# Patient Record
Sex: Female | Born: 1980 | Race: White | Hispanic: No | Marital: Single | Smoking: Former smoker
Health system: Southern US, Community
[De-identification: ages and names within clinical notes are randomized; demographics above are authoritative.]

## PROBLEM LIST (undated history)

## (undated) DIAGNOSIS — C801 Malignant (primary) neoplasm, unspecified: Secondary | ICD-10-CM

## (undated) DIAGNOSIS — R519 Headache, unspecified: Secondary | ICD-10-CM

## (undated) DIAGNOSIS — G43909 Migraine, unspecified, not intractable, without status migrainosus: Secondary | ICD-10-CM

## (undated) DIAGNOSIS — R51 Headache: Secondary | ICD-10-CM

## (undated) DIAGNOSIS — F419 Anxiety disorder, unspecified: Secondary | ICD-10-CM

## (undated) DIAGNOSIS — Z87442 Personal history of urinary calculi: Secondary | ICD-10-CM

## (undated) DIAGNOSIS — F329 Major depressive disorder, single episode, unspecified: Secondary | ICD-10-CM

## (undated) DIAGNOSIS — N289 Disorder of kidney and ureter, unspecified: Secondary | ICD-10-CM

## (undated) DIAGNOSIS — F32A Depression, unspecified: Secondary | ICD-10-CM

## (undated) HISTORY — PX: KIDNEY STONE SURGERY: SHX686

## (undated) HISTORY — PX: LITHOTRIPSY: SUR834

## (undated) HISTORY — PX: TONSILLECTOMY: SUR1361

## (undated) HISTORY — DX: Depression, unspecified: F32.A

## (undated) HISTORY — DX: Major depressive disorder, single episode, unspecified: F32.9

## (undated) HISTORY — DX: Anxiety disorder, unspecified: F41.9

---

## 2015-04-17 ENCOUNTER — Ambulatory Visit: Payer: Self-pay | Admitting: Urology

## 2015-04-17 VITALS — BP 110/70 | HR 91 | Temp 98.6°F | Wt 170.0 lb

## 2015-04-17 DIAGNOSIS — N200 Calculus of kidney: Secondary | ICD-10-CM

## 2015-04-17 DIAGNOSIS — F419 Anxiety disorder, unspecified: Secondary | ICD-10-CM

## 2015-04-17 NOTE — Progress Notes (Signed)
       Patient: Denise Patrick Female    DOB: 13-May-1980   35 y.o.   MRN: [id number] Visit Date: 04/17/2015  Today's Provider: [place]   Chief Complaint  Patient presents with  . Establish Care  . Back Pain    X 3 weeks, patient states that she would like to have her kidneys checked while she is here  . Anxiety   Subjective:    HPI Patient has a history of stones.  She has not had any imaging studies in over one year.  She has had ESWL, URS with stents and passing them spontaneously.  Stone compostion is unknown at this time.    Patient has had an episode of right flank pain recently without hematuria.    She also thinks she may be pregnant and would like a pregnancy test.      No Known Allergies Previous Medications   BUSPIRONE (BUSPAR) 15 MG TABLET    Take 15 mg by mouth daily.   FLUOXETINE (PROZAC) 20 MG CAPSULE    Take 20 mg by mouth daily.    Review of Systems  Constitutional: Negative for fever, activity change and appetite change.  HENT: Negative for sore throat and tinnitus.   Eyes: Negative for discharge and itching.  Respiratory: Negative for apnea, choking and shortness of breath.   Cardiovascular: Negative for chest pain and leg swelling.  Gastrointestinal: Positive for nausea. Negative for diarrhea and constipation.  Endocrine: Negative for cold intolerance and heat intolerance.  Genitourinary: Positive for urgency. Negative for hematuria.  Allergic/Immunologic: Negative for environmental allergies.  Neurological: Negative for dizziness and syncope.  Psychiatric/Behavioral: Positive for agitation. The patient is nervous/anxious.     Social History  Substance Use Topics  . Smoking status: Current Every Day Smoker -- 0.25 packs/day    Types: Cigarettes  . Smokeless tobacco: Never Used  . Alcohol Use: No   Objective:   BP 110/70 mmHg  Pulse 91  Temp(Src) 98.6 F (37 C)  Wt 170 lb (77.111 kg)  SpO2 98%  LMP 04/02/2015  Physical Exam    Constitutional: She is oriented to person, place, and time. She appears well-developed and well-nourished.  HENT:  Head: Normocephalic and atraumatic.  Eyes: Conjunctivae and EOM are normal. Pupils are equal, round, and reactive to light.  Cardiovascular: Normal rate, regular rhythm and normal heart sounds.   Pulmonary/Chest: Effort normal and breath sounds normal.  Abdominal: Soft. She exhibits no distension and no mass. There is tenderness. There is no rebound and no guarding.  Right cva tenderness  Musculoskeletal: Normal range of motion.  Neurological: She is alert and oriented to person, place, and time.  Skin: Skin is warm and dry.        Assessment & Plan:    History of stones  KUB  TSH, CBC, CMP, UA, HCG  Anxiety  Needs a different med for anxiety, buspar is not helping  Refer to mental health          [place]

## 2015-04-18 LAB — MICROSCOPIC EXAMINATION: Casts: NONE SEEN /LPF

## 2015-04-18 LAB — CBC WITH DIFFERENTIAL/PLATELET
Basophils Absolute: 0 x10E3/uL (ref 0.0–0.2)
Basos: 1 %
EOS (ABSOLUTE): 0.4 x10E3/uL (ref 0.0–0.4)
Eos: 5 %
Hematocrit: 40.1 % (ref 34.0–46.6)
Hemoglobin: 13.1 g/dL (ref 11.1–15.9)
Immature Grans (Abs): 0 x10E3/uL (ref 0.0–0.1)
Immature Granulocytes: 0 %
Lymphocytes Absolute: 2.5 x10E3/uL (ref 0.7–3.1)
Lymphs: 29 %
MCH: 29.9 pg (ref 26.6–33.0)
MCHC: 32.7 g/dL (ref 31.5–35.7)
MCV: 92 fL (ref 79–97)
Monocytes Absolute: 0.6 x10E3/uL (ref 0.1–0.9)
Monocytes: 7 %
Neutrophils Absolute: 5 x10E3/uL (ref 1.4–7.0)
Neutrophils: 58 %
Platelets: 369 x10E3/uL (ref 150–379)
RBC: 4.38 x10E6/uL (ref 3.77–5.28)
RDW: 13.9 % (ref 12.3–15.4)
WBC: 8.5 x10E3/uL (ref 3.4–10.8)

## 2015-04-18 LAB — TSH: TSH: 0.988 u[IU]/mL (ref 0.450–4.500)

## 2015-04-18 LAB — URINALYSIS, COMPLETE
Bilirubin, UA: NEGATIVE
GLUCOSE, UA: NEGATIVE
Ketones, UA: NEGATIVE
Leukocytes, UA: NEGATIVE
Nitrite, UA: NEGATIVE
PROTEIN UA: NEGATIVE
Specific Gravity, UA: 1.013 (ref 1.005–1.030)
Urobilinogen, Ur: 0.2 mg/dL (ref 0.2–1.0)
pH, UA: 6 (ref 5.0–7.5)

## 2015-04-18 LAB — HCG, SERUM, QUALITATIVE: hCG,Beta Subunit,Qual,Serum: NEGATIVE m[IU]/mL

## 2015-05-01 ENCOUNTER — Ambulatory Visit: Payer: Self-pay | Admitting: Family Medicine

## 2015-05-01 VITALS — BP 117/59 | HR 70 | Resp 16 | Ht 66.0 in | Wt 170.0 lb

## 2015-05-01 DIAGNOSIS — H539 Unspecified visual disturbance: Secondary | ICD-10-CM | POA: Insufficient documentation

## 2015-05-01 DIAGNOSIS — K0889 Other specified disorders of teeth and supporting structures: Secondary | ICD-10-CM

## 2015-05-01 DIAGNOSIS — M549 Dorsalgia, unspecified: Secondary | ICD-10-CM | POA: Insufficient documentation

## 2015-05-01 MED ORDER — MELOXICAM 15 MG PO TABS: 15.0000 mg | ORAL_TABLET | Freq: Every day | ORAL | Status: DC

## 2015-05-01 NOTE — Progress Notes (Signed)
       Patient: Denise Patrick Female    DOB: 02/22/1980   35 y.o.   MRN: [id number] Visit Date: 05/01/2015  Today's Provider: [place]   Chief Complaint  Patient presents with  . Follow-up    review bloodwork, smoking cessation progress   Subjective:    HPI 20 you female here for recheck . Needs her eyes checked.   Can not see far away. Also needs referral to the dental clinic for tooth pulled.  Has severe back pain. Was on Mobic. Did help. Has not has in 2 months.  Was in prison at the time.      No Known Allergies Previous Medications   BUSPIRONE (BUSPAR) 15 MG TABLET    Take 15 mg by mouth daily.   FLUOXETINE (PROZAC) 20 MG CAPSULE    Take 20 mg by mouth daily.    Review of Systems  Musculoskeletal: Positive for back pain. Neck pain: Upper back, worse with stress.     Social History  Substance Use Topics  . Smoking status: Current Every Day Smoker -- 0.25 packs/day    Types: Cigarettes  . Smokeless tobacco: Never Used  . Alcohol Use: No   Objective:   BP 117/59 mmHg  Pulse 70  Resp 16  Ht 5\' 6"  (1.676 m)  Wt 170 lb (77.111 kg)  BMI 27.45 kg/m2  LMP 04/02/2015  Physical Exam  Constitutional: She is oriented to person, place, and time. She appears well-developed and well-nourished.  Neurological: She is alert and oriented to person, place, and time.  Psychiatric: She has a normal mood and affect. Her behavior is normal. Judgment and thought content normal.  Patient irritated that her referral where not done.        Assessment & Plan:     1. Upper back pain Will refill meloxicam. Did help her previously.  - meloxicam (MOBIC) 15 MG tablet; Take 1 tablet (15 mg total) by mouth daily.  Dispense: 90 tablet; Refill: 3  2. Vision changes Will refer.    3. Tooth pain Will refer to dental clinic.    Margarita Rana, MD       [facility]

## 2015-05-09 ENCOUNTER — Ambulatory Visit: Payer: Self-pay | Admitting: Ophthalmology

## 2015-05-16 ENCOUNTER — Ambulatory Visit: Payer: Self-pay | Admitting: Ophthalmology

## 2015-05-21 ENCOUNTER — Ambulatory Visit: Payer: Self-pay

## 2015-06-06 ENCOUNTER — Encounter (HOSPITAL_COMMUNITY): Payer: Self-pay | Admitting: *Deleted

## 2015-06-27 ENCOUNTER — Ambulatory Visit: Payer: Self-pay | Attending: Oncology | Admitting: *Deleted

## 2015-06-27 ENCOUNTER — Encounter: Payer: Self-pay | Admitting: *Deleted

## 2015-06-27 DIAGNOSIS — R87610 Atypical squamous cells of undetermined significance on cytologic smear of cervix (ASC-US): Secondary | ICD-10-CM

## 2015-06-27 NOTE — Progress Notes (Signed)
35 year old White female referred to [facility] by Etheleen Sia, RN [facility] nurse at [facility] for further evaluation of an abnormal pap of HPV positive ASCUS that was completed through one of [facility] free screening programs.  Patient has been screened for eligibility.  She does not have any insurance, Medicare or Medicaid.  She also meets financial eligibility.  Hand-out given on the Affordable Care Act.  Patient will be scheduled to see one of the GYN physicians at [facility] by Jeanella Anton for further evaluation.  Will follow-up per [facility] protocol.

## 2015-08-01 ENCOUNTER — Encounter: Payer: Self-pay | Admitting: Obstetrics and Gynecology

## 2015-09-06 ENCOUNTER — Ambulatory Visit (INDEPENDENT_AMBULATORY_CARE_PROVIDER_SITE_OTHER): Payer: Medicaid Other | Admitting: Obstetrics and Gynecology

## 2015-09-06 ENCOUNTER — Encounter: Payer: Self-pay | Admitting: Obstetrics and Gynecology

## 2015-09-06 ENCOUNTER — Other Ambulatory Visit: Payer: Self-pay | Admitting: Obstetrics and Gynecology

## 2015-09-06 VITALS — BP 111/68 | HR 91 | Ht 66.0 in | Wt 190.6 lb

## 2015-09-06 DIAGNOSIS — R896 Abnormal cytological findings in specimens from other organs, systems and tissues: Secondary | ICD-10-CM

## 2015-09-06 DIAGNOSIS — Z720 Tobacco use: Secondary | ICD-10-CM | POA: Insufficient documentation

## 2015-09-06 DIAGNOSIS — IMO0002 Reserved for concepts with insufficient information to code with codable children: Secondary | ICD-10-CM | POA: Insufficient documentation

## 2015-09-06 DIAGNOSIS — N946 Dysmenorrhea, unspecified: Secondary | ICD-10-CM | POA: Diagnosis not present

## 2015-09-06 DIAGNOSIS — N809 Endometriosis, unspecified: Secondary | ICD-10-CM | POA: Insufficient documentation

## 2015-09-06 MED ORDER — NORGESTIMATE-ETH ESTRADIOL 0.25-35 MG-MCG PO TABS: 1.0000 | ORAL_TABLET | Freq: Every day | ORAL | 11 refills | Status: DC

## 2015-09-06 NOTE — Patient Instructions (Signed)
Colposcopy Care After Colposcopy is a procedure in which a special tool is used to magnify the surface of the cervix. A tissue sample (biopsy) may also be taken. This sample will be looked at for cervical cancer or other problems. After the test:  You may have some cramping.  Lie down for a few minutes if you feel lightheaded.   You may have some bleeding which should stop in a few days. HOME CARE  Do not have sex or use tampons for 2 to 3 days or as told.  Only take medicine as told by your doctor.  Continue to take your birth control pills as usual. Finding out the results of your test Ask when your test results will be ready. Make sure you get your test results. GET HELP RIGHT AWAY IF:  You are bleeding a lot or are passing blood clots.  You develop a fever of 102 F (38.9 C) or higher.  You have abnormal vaginal discharge.  You have cramps that do not go away with medicine.  You feel lightheaded, dizzy, or pass out (faint). MAKE SURE YOU:   Understand these instructions.  Will watch your condition.  Will get help right away if you are not doing well or get worse.   This information is not intended to replace advice given to you by your health care provider. Make sure you discuss any questions you have with your health care provider.   Document Released: 06/25/2007 Document Revised: 03/31/2011 Document Reviewed: 08/05/2012 Elsevier Interactive Patient Education [employer].    Endometriosis Endometriosis is a condition in which the tissue that lines the uterus (endometrium) grows outside of its normal location. The tissue may grow in many locations close to the uterus, but it commonly grows on the ovaries, fallopian tubes, vagina, or bowel. Because the uterus expels, or sheds, its lining every menstrual cycle, there is bleeding wherever the endometrial tissue is located. This can cause pain because blood is irritating to tissues not normally exposed to it.    CAUSES  The cause of endometriosis is not known.  SIGNS AND SYMPTOMS  Often, there are no symptoms. When symptoms are present, they can vary with the location of the displaced tissue. Various symptoms can occur at different times. Although symptoms occur mainly during a woman's menstrual period, they can also occur midcycle and usually stop with menopause. Some people may go months with no symptoms at all. Symptoms may include:   Back or abdominal pain.   Heavier bleeding during periods.   Pain during intercourse.   Painful bowel movements.   Infertility. DIAGNOSIS  Your health care provider will do a physical exam and ask about your symptoms. Various tests may be done, such as:   Blood tests and urine tests. These are done to help rule out other problems.   Ultrasound. This test is done to look for abnormal tissue.   An X-ray of the lower bowel (barium enema).  Laparoscopy. In this procedure, a thin, lighted tube with a tiny camera on the end (laparoscope) is inserted into your abdomen. This helps your health care provider look for abnormal tissue to confirm the diagnosis. The health care provider may also remove a small piece of tissue (biopsy) from any abnormal tissue found. This tissue sample can then be sent to a lab so it can be looked at under a microscope. TREATMENT  Treatment will vary and may include:   Medicines to relieve pain. Nonsteroidal anti-inflammatory drugs (NSAIDs) are a  type of pain medicine that can help to relieve the pain caused by endometriosis.  Hormonal therapy. When using hormonal therapy, periods are eliminated. This eliminates the monthly exposure to blood by the displaced endometrial tissue.   Surgery. Surgery may sometimes be done to remove the abnormal endometrial tissue. In severe cases, surgery may be done to remove the fallopian tubes, uterus, and ovaries (hysterectomy). HOME CARE INSTRUCTIONS   Take all medicines as directed by your  health care provider. Do not take aspirin because it may increase bleeding when you are not on hormonal therapy.   Avoid activities that produce pain, including sexual activity. SEEK MEDICAL CARE IF:  You have pelvic pain before, after, or during your periods.  You have pelvic pain between periods that gets worse during your period.  You have pelvic pain during or after sex.  You have pelvic pain with bowel movements or urination, especially during your period.  You have problems getting pregnant.  You have a fever. SEEK IMMEDIATE MEDICAL CARE IF:   Your pain is severe and is not responding to pain medicine.   You have severe nausea and vomiting, or you cannot keep foods down.   You have pain that is limited to the right lower part of your abdomen.   You have swelling or increasing pain in your abdomen.   You see blood in your stool.  MAKE SURE YOU:   Understand these instructions.  Will watch your condition.  Will get help right away if you are not doing well or get worse.   This information is not intended to replace advice given to you by your health care provider. Make sure you discuss any questions you have with your health care provider.   Document Released: 01/04/2000 Document Revised: 01/27/2014 Document Reviewed: 09/03/2012 Elsevier Interactive Patient Education [employer].

## 2015-09-06 NOTE — Progress Notes (Signed)
GYN ENCOUNTER NOTE  Subjective:       Denise Patrick is a 35 y.o. G93P2002 female is here for gynecologic evaluation of the following issues:  1. ASCUS/positive Pap smear  Patient was evaluated at the [facility] program and had ASCUS/positive Pap smear. She has long history of endometriosis; severe dysmenorrhea and pelvic pain currently is not treated. Patient is a 2 pack a day smoker. Patient has not had any new partners recently. She has a history of Pap smear abnormalities in 2010 ; did not receive any treatment   Gynecologic History Patient's last menstrual period was 08/25/2015. Contraception: condoms Last Pap: ASCUS/positive  Obstetric History OB History  Gravida Para Term Preterm AB Living  2 2 2     2   SAB TAB Ectopic Multiple Live Births          2    # Outcome Date GA Lbr Len/2nd Weight Sex Delivery Anes PTL Lv  2 Term 2006   7 lb 1.8 oz (3.225 kg) F Vag-Spont   LIV  1 Term 2002   6 lb 14.4 oz (3.13 kg) F Vag-Spont   LIV      Past Medical History:  Diagnosis Date  . Anxiety   . Depression     Past Surgical History:  Procedure Laterality Date  . KIDNEY STONE SURGERY    . TONSILLECTOMY      No current outpatient prescriptions on file prior to visit.   No current facility-administered medications on file prior to visit.     No Known Allergies  Social History   Social History  . Marital status: Unknown    Spouse name: N/A  . Number of children: N/A  . Years of education: N/A   Occupational History  . Not on file.   Social History Main Topics  . Smoking status: Current Every Day Smoker    Packs/day: 0.25    Types: Cigarettes  . Smokeless tobacco: Never Used  . Alcohol use No  . Drug use: No  . Sexual activity: Not Currently    Birth control/ protection: None   Other Topics Concern  . Not on file   Social History Narrative  . No narrative on file    Family History  Problem Relation Age of Onset  . Hypertension Father   . Cancer Father   .  Cancer Maternal Grandmother   . COPD Maternal Grandmother     The following portions of the patient's history were reviewed and updated as appropriate: allergies, current medications, past family history, past medical history, past social history, past surgical history and problem list.  Review of Systems Review of Systems - Per history of present illness Objective:   BP 111/68   Pulse 91   Ht 5\' 6"  (1.676 m)   Wt 190 lb 9.6 oz (86.5 kg)   LMP 08/25/2015   BMI 30.76 kg/m  CONSTITUTIONAL: Well-developed, well-nourished female in no acute distress.  HENT:  Normocephalic, atraumatic.  NECK: Not examined SKIN: Skin is warm and dry. No rash noted. Not diaphoretic. No erythema. No pallor. [place]: Alert and oriented to person, place, and time. PSYCHIATRIC: Normal mood and affect. Normal behavior. Normal judgment and thought content. CARDIOVASCULAR:Not Examined RESPIRATORY: Not Examined BREASTS: Not Examined ABDOMEN: Soft, non distended; Non tender.  No Organomegaly. PELVIC:  External Genitalia: Normal  BUS: Normal  Vagina: Normal  Cervix: Normal; no gross lesions; parous  Uterus: Not examined  Adnexa: Normal  RV: Normal external exam  Bladder: Nontender MUSCULOSKELETAL: Normal  range of motion. No tenderness.  No cyanosis, clubbing, or edema.  PROCEDURE: Colposcopy of upper adjacent vagina with cervical biopsy and ECC Indications: ASCUS/positive Pap smear Findings: Inadequate colposcopy; acetowhite epithelium is located at the 5 through 6 through 7:00 cervix with full extent of the lesion cannot be seen; mosaicism at 6:00; mosaicism at 7:00; vagina normal Biopsies:  ECC  Cervix biopsy 6:00  Cervix biopsy 7:00 Description: Patient was placed in the dorsal lithotomy position. A Graves' speculum was placed in the vagina. Acetic acid was used to paint the cervix and vagina to assess for abnormalities. The colposcope was used to identify the noted abnormal lesions. These lesions  were biopsied with a Tischler biopsy forceps. ECC was performed with a serrated curette. Monsel solution was applied for hemostasis. Blood loss-15 cc. Patient had fair tolerance of the procedure; was given Advil following procedure for cramps      Assessment:   1. ASCUS with positive high risk HPV  2. Endometriosis  3. Tobacco user     Plan:   1. Colposcopy with biopsies as noted (ECC, cervix 6:00, cervix 7:00) 2. Smoking cessation encouraged 3. Sprintec OCPs are given for endometriosis management 4. Return in 2 weeks for follow-up  A total of 30 minutes were spent face-to-face with the patient during the encounter with greater than 50% dealing with counseling and coordination of care.  Brayton Mars, MD  Note: This dictation was prepared with Dragon dictation along with smaller phrase technology. Any transcriptional errors that result from this process are unintentional.

## 2015-09-10 ENCOUNTER — Encounter: Payer: Self-pay | Admitting: Obstetrics and Gynecology

## 2015-09-10 LAB — PATHOLOGY

## 2015-09-12 ENCOUNTER — Encounter: Payer: Self-pay | Admitting: Internal Medicine

## 2015-09-12 ENCOUNTER — Other Ambulatory Visit: Payer: Self-pay

## 2015-09-12 ENCOUNTER — Ambulatory Visit: Payer: Self-pay | Admitting: Internal Medicine

## 2015-09-12 VITALS — BP 113/64 | HR 85 | Temp 98.7°F | Wt 191.0 lb

## 2015-09-12 DIAGNOSIS — G43909 Migraine, unspecified, not intractable, without status migrainosus: Secondary | ICD-10-CM | POA: Insufficient documentation

## 2015-09-12 DIAGNOSIS — L709 Acne, unspecified: Secondary | ICD-10-CM

## 2015-09-12 MED ORDER — TOPIRAMATE 25 MG PO TABS: 25.0000 mg | ORAL_TABLET | Freq: Two times a day (BID) | ORAL | 3 refills | Status: DC

## 2015-09-12 MED ORDER — NORGESTIMATE-ETH ESTRADIOL 0.25-35 MG-MCG PO TABS: 1.0000 | ORAL_TABLET | Freq: Every day | ORAL | 11 refills | Status: DC

## 2015-09-12 MED ORDER — TRIAMCINOLONE ACETONIDE 0.1 % EX CREA: 1.0000 "application " | TOPICAL_CREAM | Freq: Two times a day (BID) | CUTANEOUS | 0 refills | Status: DC

## 2015-09-12 NOTE — Progress Notes (Signed)
   Subjective:    Patient ID: Denise Patrick, female    DOB: 1980/11/23, 35 y.o.   MRN: [id number]  HPI   Pt. Presents w/ migraines. Reports having migraines for 2 mnths w/ trouble remaining asleep. Reports severe pain w/ no relief from Alleve. Migraines last for 1-2 days w/ nausea and light irritation. Reports taking a 80 count bottle or more of Excedrin a week.  Pt. Presents w/ cysts on face that are painful. Reports that they remain on her face for a long period. Reports that hasn't had acne for awhile until now.  Pt. Reports abnormal sleep patterns w/ trouble staying asleep. She reports she has recently started a new job.    Patient Active Problem List   Diagnosis Date Noted  . Migraine 09/12/2015  . Endometriosis 09/06/2015  . Dysmenorrhea 09/06/2015  . ASCUS with positive high risk HPV 09/06/2015  . Tobacco user 09/06/2015  . Upper back pain 05/01/2015  . Vision changes 05/01/2015  . Tooth pain 05/01/2015     Medication List       Accurate as of 09/12/15 10:18 AM. Always use your most recent med list.          norgestimate-ethinyl estradiol 0.25-35 MG-MCG tablet Commonly known as:  ORTHO-CYCLEN,SPRINTEC,PREVIFEM Take 1 tablet by mouth daily.   TRINTELLIX 10 MG Tabs Generic drug:  vortioxetine HBr Take by mouth.        Review of Systems Cysts on face     Objective:   Physical Exam  Constitutional: She is oriented to person, place, and time.  Cardiovascular: Normal rate, regular rhythm and normal heart sounds.   Pulmonary/Chest: Effort normal and breath sounds normal.  Neurological: She is alert and oriented to person, place, and time.    BP 113/64   Pulse 85   Temp 98.7 F (37.1 C)   Wt 191 lb (86.6 kg)   LMP 08/25/2015   BMI 30.83 kg/m        Assessment & Plan:  Referral to dermatologist for acne. Labs today: Met C, CBC, UA, Lipid, Sed Rat  Pt. Advised on sleep techniques.

## 2015-09-12 NOTE — Patient Instructions (Signed)
Referral to dermatologist for acne Labs today: Met C, CBC, UA, Lipid, Sed Rat

## 2015-09-13 LAB — LIPID PANEL
CHOLESTEROL TOTAL: 159 mg/dL (ref 100–199)
Chol/HDL Ratio: 3.3 ratio units (ref 0.0–4.4)
HDL: 48 mg/dL (ref >39)
LDL Calculated: 86 mg/dL (ref 0–99)
TRIGLYCERIDES: 126 mg/dL (ref 0–149)
VLDL CHOLESTEROL CAL: 25 mg/dL (ref 5–40)

## 2015-09-13 LAB — COMPREHENSIVE METABOLIC PANEL
ALT: 13 IU/L (ref 0–32)
AST: 17 IU/L (ref 0–40)
Albumin/Globulin Ratio: 1.7 (ref 1.2–2.2)
Albumin: 4.3 g/dL (ref 3.5–5.5)
Alkaline Phosphatase: 59 IU/L (ref 39–117)
BILIRUBIN TOTAL: 0.3 mg/dL (ref 0.0–1.2)
BUN/Creatinine Ratio: 21 (ref 9–23)
BUN: 18 mg/dL (ref 6–20)
CHLORIDE: 106 mmol/L (ref 96–106)
CO2: 19 mmol/L (ref 18–29)
Calcium: 8.9 mg/dL (ref 8.7–10.2)
Creatinine, Ser: 0.86 mg/dL (ref 0.57–1.00)
GFR calc Af Amer: 103 mL/min/{1.73_m2} (ref >59)
GFR calc non Af Amer: 89 mL/min/{1.73_m2} (ref >59)
GLUCOSE: 95 mg/dL (ref 65–99)
Globulin, Total: 2.5 g/dL (ref 1.5–4.5)
POTASSIUM: 4.5 mmol/L (ref 3.5–5.2)
Sodium: 140 mmol/L (ref 134–144)
Total Protein: 6.8 g/dL (ref 6.0–8.5)

## 2015-09-13 LAB — CBC
HEMATOCRIT: 41 % (ref 34.0–46.6)
HEMOGLOBIN: 13.5 g/dL (ref 11.1–15.9)
MCH: 30.3 pg (ref 26.6–33.0)
MCHC: 32.9 g/dL (ref 31.5–35.7)
MCV: 92 fL (ref 79–97)
Platelets: 393 10*3/uL — ABNORMAL HIGH (ref 150–379)
RBC: 4.45 x10E6/uL (ref 3.77–5.28)
RDW: 14.1 % (ref 12.3–15.4)
WBC: 7.4 10*3/uL (ref 3.4–10.8)

## 2015-09-13 LAB — URINALYSIS
BILIRUBIN UA: NEGATIVE
Glucose, UA: NEGATIVE
Ketones, UA: NEGATIVE
Leukocytes, UA: NEGATIVE
NITRITE UA: NEGATIVE
PH UA: 6 (ref 5.0–7.5)
PROTEIN UA: NEGATIVE
SPEC GRAV UA: 1.019 (ref 1.005–1.030)
UUROB: 0.2 mg/dL (ref 0.2–1.0)

## 2015-09-13 LAB — SEDIMENTATION RATE: Sed Rate: 6 mm/hr (ref 0–32)

## 2015-09-18 ENCOUNTER — Telehealth: Payer: Self-pay | Admitting: *Deleted

## 2015-09-18 NOTE — Telephone Encounter (Signed)
Left patient a message to return my call.  Patient has CINII-III.  I would like for her to complete [facility] Medicaid paperwork.

## 2015-09-27 ENCOUNTER — Ambulatory Visit (INDEPENDENT_AMBULATORY_CARE_PROVIDER_SITE_OTHER): Payer: Medicaid Other | Admitting: Obstetrics and Gynecology

## 2015-09-27 ENCOUNTER — Encounter: Payer: Self-pay | Admitting: *Deleted

## 2015-09-27 DIAGNOSIS — D069 Carcinoma in situ of cervix, unspecified: Secondary | ICD-10-CM | POA: Diagnosis not present

## 2015-09-27 NOTE — Progress Notes (Signed)
Patient came by the office today after her visit with Dr. Enzo Bi to complete her [facility] Medicaid application.  Application completed.  No questions at this time.  She is to be scheduled for a LEEP.  Faxed application to DSS.

## 2015-09-27 NOTE — Patient Instructions (Signed)
1. LEEP cone biopsy is scheduled for management of endocervical dysplasia 2. Return week before surgery for preoperative appointment  Loop Electrosurgical Excision Procedure Loop electrosurgical excision procedure (LEEP) is the removal of a portion of the lower part of the uterus (cervix). The procedure is done when there are significantly abnormal cervical cell changes. Abnormal cell changes of the cervix can lead to cancer if left in place and untreated.  The LEEP procedure itself typically only takes a few minutes. Often, it may be done in your caregiver's office. The procedure is considered safe for those who wish to get pregnant or are trying to get pregnant. Only under rare circumstances should this procedure be done if you are pregnant. LET YOUR CAREGIVER KNOW ABOUT:  Whether you are pregnant or late for your last menstrual period.  Allergies to foods or medicines.  All the medicines you are taking includingherbs, eyedrops, and over-the-counter medicines, and creams.  Use of steroids (by mouth or creams).  Previous problems with anesthetics or numbing medicine.  Previous gynecological surgery.  History of blood clots or bleeding problems.  Any recent or current vaginal infections (herpes, sexually transmitted infections).  Other health problems. RISKS AND COMPLICATIONS  Bleeding.  Infection.  Injury to the vagina, bladder, or rectum.  Very rare obstruction of the cervical opening that causes problems during menstruation (cervical stenosis). BEFORE THE PROCEDURE  Do not take aspirin or blood thinners (anticoagulants) for 1 week before the procedure, or as told by your caregiver.  Eat a light meal before the procedure.  Ask your caregiver about changing or stopping your regular medicines.  You may be given a pain reliever 1 or 2 hours before the procedure. PROCEDURE   A tool (speculum) is placed in the vagina. This allows your caregiver to see the cervix.  An  iodine stain is applied to the cervix to find the area of abnormal cells to be removed.  Medicine is injected to numb the cervix (local anesthetic).   Electricity is passed through a thin wire loop which is then used to remove (cauterize) a small segment of the affected cervix.  Light electrocautery is used to seal any small blood vessels and prevent bleeding.  A paste may be applied to the cauterized area of the cervix to help prevent bleeding.  The tissue sample is sent to the lab. It is examined under the microscope. AFTER THE PROCEDURE  Have someone drive you home.  You may have slight to moderate cramping.  You may notice a black vaginal discharge from the paste used on the cervix to prevent bleeding. This is normal.  Watch for excessive bleeding. This requires immediate medical care.  Ask when your test results will be ready. Make sure you get your test results.   This information is not intended to replace advice given to you by your health care provider. Make sure you discuss any questions you have with your health care provider.   Document Released: 03/29/2002 Document Revised: 03/31/2011 Document Reviewed: 06/18/2010 Elsevier Interactive Patient Education [employer].

## 2015-09-27 NOTE — Progress Notes (Signed)
Chief complaint: 1. Abnormal Pap smear 2. Follow-up on colposcopic directed biopsies  Recent colposcopy was performed for ASCUS/positive high risk HPV Pap smear. Colposcopy demonstrated acetowhite epithelium and mosaicism between the 5:00, 6:00, and 7:00 region. The SCJ was not visualized.  Pathology: Diagnosis:  Part A: CERVIX, 6:00, BIOPSY:  NEGATIVE FOR DYSPLASIA AND MALIGNANCY. BENIGN CERVICAL TISSUE.  Part B: CERVIX, 7:00, BIOPSY:  KOILOCYTOSIS SUGGESTIVE OF HUMAN PAPILLOMAVIRUS (HPV) EFFECT.  Part C: ENDOCERVIX, CURETTINGS:  STRIPS OF SQUAMOUS EPITHELIUM WITH HIGH GRADE SQUAMOUS  INTRAEPITHELIAL LESION (CIN 2-3). MINUTE FRAGMENTS OF BENIGN  ENDOCERVICAL GLANDS, MUCUS, AND BLOOD.  SBI/09/10/2015   OBJECTIVE: BP 109/64   Pulse 75   Ht 5\' 6"  (1.676 m)   Wt 192 lb 3.2 oz (87.2 kg)   LMP 09/27/2015 (Exact Date)   BMI 31.02 kg/m  Physical exam-deferred  ASSESSMENT: 1. ASCUS/positive high risk HPV Pap smear 2. Inadequate colposcopy 3. 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 LEEP cone biopsy scheduled 2. Return for preoperative appointment prior surgery 3. Quit smoking 4. Condom use encouraged  A total of 15 minutes were spent face-to-face with the patient during this encounter and over half of that time dealt with counseling and coordination of care.  Brayton Mars, MD  Note: This dictation was prepared with Dragon dictation along with smaller phrase technology. Any transcriptional errors that result from this process are unintentional.

## 2015-11-01 ENCOUNTER — Ambulatory Visit (INDEPENDENT_AMBULATORY_CARE_PROVIDER_SITE_OTHER): Payer: Medicaid Other | Admitting: Obstetrics and Gynecology

## 2015-11-01 ENCOUNTER — Encounter
Admission: RE | Admit: 2015-11-01 | Discharge: 2015-11-01 | Disposition: A | Discharge status: Home / Self Care | Payer: Medicaid Other | Source: Ambulatory Visit | Attending: Obstetrics and Gynecology | Admitting: Obstetrics and Gynecology

## 2015-11-01 VITALS — BP 101/66 | HR 72 | Ht 66.0 in | Wt 193.9 lb

## 2015-11-01 DIAGNOSIS — Z01812 Encounter for preprocedural laboratory examination: Secondary | ICD-10-CM | POA: Insufficient documentation

## 2015-11-01 DIAGNOSIS — Z01818 Encounter for other preprocedural examination: Secondary | ICD-10-CM

## 2015-11-01 DIAGNOSIS — D069 Carcinoma in situ of cervix, unspecified: Secondary | ICD-10-CM

## 2015-11-01 HISTORY — DX: Personal history of urinary calculi: Z87.442

## 2015-11-01 HISTORY — DX: Headache: R51

## 2015-11-01 HISTORY — DX: Headache, unspecified: R51.9

## 2015-11-01 LAB — CBC WITH DIFFERENTIAL/PLATELET
Basophils Absolute: 0.1 10*3/uL (ref 0–0.1)
Basophils Relative: 1 %
EOS ABS: 0.3 10*3/uL (ref 0–0.7)
Eosinophils Relative: 3 %
HEMATOCRIT: 39.8 % (ref 35.0–47.0)
HEMOGLOBIN: 13.8 g/dL (ref 12.0–16.0)
LYMPHS ABS: 1.9 10*3/uL (ref 1.0–3.6)
Lymphocytes Relative: 19 %
MCH: 31 pg (ref 26.0–34.0)
MCHC: 34.8 g/dL (ref 32.0–36.0)
MCV: 89 fL (ref 80.0–100.0)
MONOS PCT: 8 %
Monocytes Absolute: 0.8 10*3/uL (ref 0.2–0.9)
NEUTROS PCT: 69 %
Neutro Abs: 7 10*3/uL — ABNORMAL HIGH (ref 1.4–6.5)
Platelets: 317 10*3/uL (ref 150–440)
RBC: 4.47 MIL/uL (ref 3.80–5.20)
RDW: 13.3 % (ref 11.5–14.5)
WBC: 10 10*3/uL (ref 3.6–11.0)

## 2015-11-01 LAB — RAPID HIV SCREEN (HIV 1/2 AB+AG)
HIV 1/2 Antibodies: NONREACTIVE
HIV-1 P24 ANTIGEN - HIV24: NONREACTIVE

## 2015-11-01 MED ORDER — NORETHIN-ETH ESTRAD-FE BIPHAS 1 MG-10 MCG / 10 MCG PO TABS: 1.0000 | ORAL_TABLET | Freq: Every day | ORAL | 11 refills | Status: DC

## 2015-11-01 NOTE — Patient Instructions (Signed)
  Your procedure is scheduled [id number] Oct. 16 , 2017. Report to Same Day Surgery. To find out your arrival time please call [phone] between 1PM - 3PM on Friday Oct. 13, 2017, .  Remember: Instructions that are not followed completely may result in serious medical risk, up to and including death, or upon the discretion of your surgeon and anesthesiologist your surgery may need to be rescheduled.    _x___ 1. Do not eat food or drink liquids after midnight. No gum chewing or hard candies.     ____ 2. No Alcohol for 24 hours before or after surgery.   ____ 3. Bring all medications with you on the day of surgery if instructed.    __x__ 4. Notify your doctor if there is any change in your medical condition     (cold, fever, infections).    __x___ 5. No smoking 24 hours prior to surgery.     Do not wear jewelry, make-up, hairpins, clips or nail polish.  Do not wear lotions, powders, or perfumes.   Do not shave 48 hours prior to surgery. Men may shave face and neck.  Do not bring valuables to the hospital.    [facility] is not responsible for any belongings or valuables.               Contacts, dentures or bridgework may not be worn into surgery.  Leave your suitcase in the car. After surgery it may be brought to your room.  For patients admitted to the hospital, discharge time is determined by your treatment team.   Patients discharged the day of surgery will not be allowed to drive home.    Please read over the following fact sheets that you were given:   [facility] Preparing for Surgery  ____ Take these medicines the morning of surgery with A SIP OF WATER: NONE     ____ Fleet Enema (as directed)   ____ Use CHG Soap as directed on instruction sheet  ____ Use inhalers on the day of surgery and bring to hospital day of surgery  ____ Stop metformin 2 days prior to surgery    ____ Take 1/2 of usual insulin dose the night before surgery and none on the morning of surgery.    ____ Stop Coumadin/Plavix/aspirin on does not apply.  _x___ Stop Anti-inflammatories such as Advil, Aleve, Ibuprofen, Motrin, Naproxen, Naprosyn, Goodies powders or aspirin products. OK to take Tylenol.   ____ Stop supplements until after surgery.    ____ Bring C-Pap to the hospital.

## 2015-11-01 NOTE — Progress Notes (Signed)
Subjective:  PREOPERATIVE HISTORY AND PHYSICAL     Patient is a 35 y.o. G2P2064female scheduled forLEEP cone biopsy of the cervix for positive ECC of high-grade dysplasia on 11/05/2015. Colposcopy was inadequate; ECC was positive for high-grade dysplasia   Menstrual History: OB History    Gravida Para Term Preterm AB Living   2 2 2     2    SAB TAB Ectopic Multiple Live Births           2      Menarche age:NA Patient's last menstrual period was 10/25/2015.    Past Medical History:  Diagnosis Date  . Anxiety   . Depression     Past Surgical History:  Procedure Laterality Date  . KIDNEY STONE SURGERY    . TONSILLECTOMY      OB History  Gravida Para Term Preterm AB Living  2 2 2     2   SAB TAB Ectopic Multiple Live Births          2    # Outcome Date GA Lbr Len/2nd Weight Sex Delivery Anes PTL Lv  2 Term 2006   7 lb 1.8 oz (3.225 kg) F Vag-Spont   LIV  1 Term 2002   6 lb 14.4 oz (3.13 kg) F Vag-Spont   LIV      Social History   Social History  . Marital status: Single    Spouse name: N/A  . Number of children: N/A  . Years of education: N/A   Social History Main Topics  . Smoking status: Current Every Day Smoker    Packs/day: 0.50    Years: 15.00    Types: Cigarettes  . Smokeless tobacco: Current User  . Alcohol use No  . Drug use: No  . Sexual activity: Not Currently    Birth control/ protection: None   Other Topics Concern  . Not on file   Social History Narrative  . No narrative on file    Family History  Problem Relation Age of Onset  . Hypertension Father   . Cancer Father   . Cancer Maternal Grandmother   . COPD Maternal Grandmother      (Not in a hospital admission)  No Known Allergies  Review of Systems Constitutional: No recent fever/chills/sweats Respiratory: No recent cough/bronchitis Cardiovascular: No chest pain Gastrointestinal: No recent nausea/vomiting/diarrhea Genitourinary: No UTI symptoms Hematologic/lymphatic:No  history of coagulopathy or recent blood thinner use    Objective:    BP 101/66   Pulse 72   Ht 5\' 6"  (1.676 m)   Wt 193 lb 14.4 oz (88 kg)   LMP 10/25/2015   BMI 31.30 kg/m   General:   Normal  Skin:   normal  HEENT:  Normal  Neck:  Supple without Adenopathy or Thyromegaly  Lungs:   Heart:              Breasts:   Abdomen:  Pelvis:  M/S   Extremeties:  Neuro:    clear to auscultation bilaterally   Normal without murmur   Not Examined   soft, non-tender; bowel sounds normal; no masses,  no organomegaly   Exam deferred to OR  No CVAT  Warm/Dry   Normal          Assessment:    Positive ECC; high-grade endocervical dysplasia   Plan:  LEEP cone biopsy of cervix  Preop counseling: Patient is to undergo LEEP cone biopsy of cervix for high-grade endocervical dysplasia. She is understanding of the planned procedure  and is aware of and is accepting of all surgical risks which include but are not limited to bleeding, infection, pelvic organ injury with need for repair, blood clot disorders, anesthesia risks, and death. All questions are answered. Informed consent is given. Patient is ready and willing to proceed with surgery as scheduled.  Brayton Mars, MD  Note: This dictation was prepared with Dragon dictation along with smaller phrase technology. Any transcriptional errors that result from this process are unintentional.

## 2015-11-01 NOTE — H&P (Signed)
Subjective:  PREOPERATIVE HISTORY AND PHYSICAL     Patient is a 35 y.o. G2P2043female scheduled forLEEP cone biopsy of the cervix for positive ECC of high-grade dysplasia on 11/05/2015. Colposcopy was inadequate; ECC was positive for high-grade dysplasia   Menstrual History: OB History    Gravida Para Term Preterm AB Living   2 2 2     2    SAB TAB Ectopic Multiple Live Births           2      Menarche age:NA Patient's last menstrual period was 10/25/2015.    Past Medical History:  Diagnosis Date  . Anxiety   . Depression     Past Surgical History:  Procedure Laterality Date  . KIDNEY STONE SURGERY    . TONSILLECTOMY      OB History  Gravida Para Term Preterm AB Living  2 2 2     2   SAB TAB Ectopic Multiple Live Births          2    # Outcome Date GA Lbr Len/2nd Weight Sex Delivery Anes PTL Lv  2 Term 2006   7 lb 1.8 oz (3.225 kg) F Vag-Spont   LIV  1 Term 2002   6 lb 14.4 oz (3.13 kg) F Vag-Spont   LIV      Social History   Social History  . Marital status: Single    Spouse name: N/A  . Number of children: N/A  . Years of education: N/A   Social History Main Topics  . Smoking status: Current Every Day Smoker    Packs/day: 0.50    Years: 15.00    Types: Cigarettes  . Smokeless tobacco: Current User  . Alcohol use No  . Drug use: No  . Sexual activity: Not Currently    Birth control/ protection: None   Other Topics Concern  . Not on file   Social History Narrative  . No narrative on file    Family History  Problem Relation Age of Onset  . Hypertension Father   . Cancer Father   . Cancer Maternal Grandmother   . COPD Maternal Grandmother      (Not in a hospital admission)  No Known Allergies  Review of Systems Constitutional: No recent fever/chills/sweats Respiratory: No recent cough/bronchitis Cardiovascular: No chest pain Gastrointestinal: No recent nausea/vomiting/diarrhea Genitourinary: No UTI symptoms Hematologic/lymphatic:No  history of coagulopathy or recent blood thinner use    Objective:    BP 101/66   Pulse 72   Ht 5\' 6"  (1.676 m)   Wt 193 lb 14.4 oz (88 kg)   LMP 10/25/2015   BMI 31.30 kg/m   General:   Normal  Skin:   normal  HEENT:  Normal  Neck:  Supple without Adenopathy or Thyromegaly  Lungs:   Heart:              Breasts:   Abdomen:  Pelvis:  M/S   Extremeties:  Neuro:    clear to auscultation bilaterally   Normal without murmur   Not Examined   soft, non-tender; bowel sounds normal; no masses,  no organomegaly   Exam deferred to OR  No CVAT  Warm/Dry   Normal          Assessment:    Positive ECC; high-grade endocervical dysplasia   Plan:  LEEP cone biopsy of cervix  Preop counseling: Patient is to undergo LEEP cone biopsy of cervix for high-grade endocervical dysplasia. She is understanding of the planned procedure  and is aware of and is accepting of all surgical risks which include but are not limited to bleeding, infection, pelvic organ injury with need for repair, blood clot disorders, anesthesia risks, and death. All questions are answered. Informed consent is given. Patient is ready and willing to proceed with surgery as scheduled.  Brayton Mars, MD  Note: This dictation was prepared with Dragon dictation along with smaller phrase technology. Any transcriptional errors that result from this process are unintentional.

## 2015-11-01 NOTE — Patient Instructions (Signed)
Return in 1 week after surgery for postop check

## 2015-11-02 LAB — RPR: RPR: NONREACTIVE

## 2015-11-05 ENCOUNTER — Encounter: Payer: Self-pay | Admitting: Anesthesiology

## 2015-11-05 ENCOUNTER — Encounter
Admission: RE | Disposition: A | Discharge status: Home / Self Care | Payer: Self-pay | Source: Ambulatory Visit | Attending: Obstetrics and Gynecology

## 2015-11-05 ENCOUNTER — Ambulatory Visit
Admission: RE | Admit: 2015-11-05 | Discharge: 2015-11-05 | Disposition: A | Discharge status: Home / Self Care | Payer: Medicaid Other | Source: Ambulatory Visit | Attending: Obstetrics and Gynecology | Admitting: Obstetrics and Gynecology

## 2015-11-05 ENCOUNTER — Ambulatory Visit: Payer: Medicaid Other | Admitting: Anesthesiology

## 2015-11-05 DIAGNOSIS — Z793 Long term (current) use of hormonal contraceptives: Secondary | ICD-10-CM | POA: Insufficient documentation

## 2015-11-05 DIAGNOSIS — F17210 Nicotine dependence, cigarettes, uncomplicated: Secondary | ICD-10-CM | POA: Insufficient documentation

## 2015-11-05 DIAGNOSIS — D060 Carcinoma in situ of endocervix: Secondary | ICD-10-CM | POA: Diagnosis not present

## 2015-11-05 DIAGNOSIS — Z79899 Other long term (current) drug therapy: Secondary | ICD-10-CM | POA: Diagnosis not present

## 2015-11-05 DIAGNOSIS — F329 Major depressive disorder, single episode, unspecified: Secondary | ICD-10-CM | POA: Insufficient documentation

## 2015-11-05 DIAGNOSIS — R87613 High grade squamous intraepithelial lesion on cytologic smear of cervix (HGSIL): Secondary | ICD-10-CM | POA: Diagnosis present

## 2015-11-05 DIAGNOSIS — D069 Carcinoma in situ of cervix, unspecified: Secondary | ICD-10-CM | POA: Diagnosis not present

## 2015-11-05 HISTORY — PX: LEEP: SHX91

## 2015-11-05 LAB — POCT PREGNANCY, URINE: Preg Test, Ur: NEGATIVE

## 2015-11-05 SURGERY — LEEP (LOOP ELECTROSURGICAL EXCISION PROCEDURE)
Anesthesia: General

## 2015-11-05 MED ORDER — LACTATED RINGERS IV SOLN
INTRAVENOUS | Status: DC
  Administered 2015-11-05 (×2): via INTRAVENOUS

## 2015-11-05 MED ORDER — ONDANSETRON HCL 4 MG/2ML IJ SOLN
INTRAMUSCULAR | Status: DC | PRN
  Administered 2015-11-05: 4 mg via INTRAVENOUS

## 2015-11-05 MED ORDER — IODINE STRONG (LUGOLS) 5 % PO SOLN
ORAL | Status: AC
  Filled 2015-11-05: qty 1

## 2015-11-05 MED ORDER — LACTATED RINGERS IV SOLN: INTRAVENOUS | Status: DC

## 2015-11-05 MED ORDER — KETOROLAC TROMETHAMINE 30 MG/ML IJ SOLN
INTRAMUSCULAR | Status: DC | PRN
  Administered 2015-11-05: 30 mg via INTRAVENOUS

## 2015-11-05 MED ORDER — PROPOFOL 10 MG/ML IV BOLUS
INTRAVENOUS | Status: DC | PRN
  Administered 2015-11-05: 130 mg via INTRAVENOUS

## 2015-11-05 MED ORDER — FENTANYL CITRATE (PF) 100 MCG/2ML IJ SOLN
25.0000 ug | INTRAMUSCULAR | Status: AC | PRN
  Administered 2015-11-05 (×6): 25 ug via INTRAVENOUS

## 2015-11-05 MED ORDER — OXYCODONE-ACETAMINOPHEN 5-325 MG PO TABS
1.0000 | ORAL_TABLET | ORAL | Status: DC | PRN
  Administered 2015-11-05: 1 via ORAL

## 2015-11-05 MED ORDER — FENTANYL CITRATE (PF) 100 MCG/2ML IJ SOLN
INTRAMUSCULAR | Status: AC
  Filled 2015-11-05: qty 2

## 2015-11-05 MED ORDER — FERRIC SUBSULFATE 259 MG/GM EX SOLN
CUTANEOUS | Status: AC
  Filled 2015-11-05: qty 8

## 2015-11-05 MED ORDER — LIDOCAINE HCL (CARDIAC) 20 MG/ML IV SOLN
INTRAVENOUS | Status: DC | PRN
  Administered 2015-11-05: 80 mg via INTRAVENOUS

## 2015-11-05 MED ORDER — LIDOCAINE-EPINEPHRINE 1 %-1:100000 IJ SOLN
INTRAMUSCULAR | Status: AC
  Filled 2015-11-05: qty 1

## 2015-11-05 MED ORDER — OXYCODONE-ACETAMINOPHEN 5-325 MG PO TABS
ORAL_TABLET | ORAL | Status: AC
  Filled 2015-11-05: qty 1

## 2015-11-05 MED ORDER — FAMOTIDINE 20 MG PO TABS
20.0000 mg | ORAL_TABLET | Freq: Once | ORAL | Status: AC
  Administered 2015-11-05: 20 mg via ORAL

## 2015-11-05 MED ORDER — FERRIC SUBSULFATE 259 MG/GM EX SOLN
CUTANEOUS | Status: DC | PRN
  Administered 2015-11-05: 1

## 2015-11-05 MED ORDER — ONDANSETRON HCL 4 MG/2ML IJ SOLN: 4.0000 mg | Freq: Once | INTRAMUSCULAR | Status: DC | PRN

## 2015-11-05 MED ORDER — FAMOTIDINE 20 MG PO TABS
ORAL_TABLET | ORAL | Status: AC
  Administered 2015-11-05: 20 mg via ORAL
  Filled 2015-11-05: qty 1

## 2015-11-05 MED ORDER — IBUPROFEN 800 MG PO TABS: 800.0000 mg | ORAL_TABLET | Freq: Three times a day (TID) | ORAL | 1 refills | Status: DC

## 2015-11-05 MED ORDER — IODINE STRONG (LUGOLS) 5 % PO SOLN
ORAL | Status: DC | PRN
  Administered 2015-11-05: 5 mL

## 2015-11-05 MED ORDER — OXYCODONE-ACETAMINOPHEN 5-325 MG PO TABS: 1.0000 | ORAL_TABLET | ORAL | 0 refills | Status: DC | PRN

## 2015-11-05 MED ORDER — FENTANYL CITRATE (PF) 100 MCG/2ML IJ SOLN
INTRAMUSCULAR | Status: DC | PRN
  Administered 2015-11-05 (×2): 50 ug via INTRAVENOUS

## 2015-11-05 MED ORDER — DEXAMETHASONE SODIUM PHOSPHATE 10 MG/ML IJ SOLN
INTRAMUSCULAR | Status: DC | PRN
  Administered 2015-11-05: 10 mg via INTRAVENOUS

## 2015-11-05 MED ORDER — MIDAZOLAM HCL 2 MG/2ML IJ SOLN
INTRAMUSCULAR | Status: DC | PRN
  Administered 2015-11-05: 2 mg via INTRAVENOUS

## 2015-11-05 MED ORDER — LIDOCAINE-EPINEPHRINE 1 %-1:100000 IJ SOLN
INTRAMUSCULAR | Status: DC | PRN
  Administered 2015-11-05: 16 mL

## 2015-11-05 SURGICAL SUPPLY — 35 items
APPLICATOR COTTON TIP 6IN STRL (MISCELLANEOUS) ×3 IMPLANT
APPLICATOR SWAB PROCTO LG 16IN (MISCELLANEOUS) ×3 IMPLANT
CANISTER SUCT 1200ML W/VALVE (MISCELLANEOUS) ×3 IMPLANT
CATH ROBINSON RED A/P 16FR (CATHETERS) ×3 IMPLANT
DEPRESSOR TONGUE BLADE STERILE (MISCELLANEOUS) ×3 IMPLANT
DRAPE UNDER BUTTOCK W/FLU (DRAPES) ×3 IMPLANT
DRSG TELFA 3X8 NADH (GAUZE/BANDAGES/DRESSINGS) ×3 IMPLANT
ELECT LEEP BALL 5MM 12CM (MISCELLANEOUS) ×3
ELECT LEEP LOOP 20X10 R2010 (MISCELLANEOUS) ×3
ELECT LOOP 1.0X1.0CM R1010 (MISCELLANEOUS) ×3
ELECT REM PT RETURN 9FT ADLT (ELECTROSURGICAL) ×3
ELECTRODE LEEP BALL 5MM 12CM (MISCELLANEOUS) ×1 IMPLANT
ELECTRODE LEP LOOP 20X10 R2010 (MISCELLANEOUS) ×1 IMPLANT
ELECTRODE LOOP 1.0X1.0CM R1010 (MISCELLANEOUS) ×1 IMPLANT
ELECTRODE REM PT RTRN 9FT ADLT (ELECTROSURGICAL) ×1 IMPLANT
GLOVE BIO SURGEON STRL SZ8 (GLOVE) ×3 IMPLANT
GOWN STRL REUS W/ TWL LRG LVL3 (GOWN DISPOSABLE) ×2 IMPLANT
GOWN STRL REUS W/ TWL XL LVL3 (GOWN DISPOSABLE) ×1 IMPLANT
GOWN STRL REUS W/TWL LRG LVL3 (GOWN DISPOSABLE) ×4
GOWN STRL REUS W/TWL XL LVL3 (GOWN DISPOSABLE) ×2
HANDLE YANKAUER SUCT BULB TIP (MISCELLANEOUS) ×3 IMPLANT
KIT RM TURNOVER CYSTO AR (KITS) ×3 IMPLANT
LABEL OR SOLS (LABEL) ×3 IMPLANT
NEEDLE SPNL 22GX3.5 QUINCKE BK (NEEDLE) ×3 IMPLANT
PACK DNC HYST (MISCELLANEOUS) ×3 IMPLANT
PAD OB MATERNITY 4.3X12.25 (PERSONAL CARE ITEMS) ×3 IMPLANT
PAD PREP 24X41 OB/GYN DISP (PERSONAL CARE ITEMS) ×3 IMPLANT
PENCIL ELECTRO HAND CTR (MISCELLANEOUS) ×3 IMPLANT
SOL PREP PVP 2OZ (MISCELLANEOUS) ×3
SOLUTION PREP PVP 2OZ (MISCELLANEOUS) ×1 IMPLANT
STRAW SMOKE EVAC LEEP 6150 NON (MISCELLANEOUS) ×3 IMPLANT
SUT SILK 2 0 SH (SUTURE) ×3 IMPLANT
SYR CONTROL 10ML (SYRINGE) ×3 IMPLANT
SYRINGE 10CC LL (SYRINGE) ×3 IMPLANT
TOWEL OR 17X26 4PK STRL BLUE (TOWEL DISPOSABLE) ×3 IMPLANT

## 2015-11-05 NOTE — Discharge Instructions (Signed)
AMBULATORY SURGERY  DISCHARGE INSTRUCTIONS   1) The drugs that you were given will stay in your system until tomorrow so for the next 24 hours you should not:  A) Drive an automobile B) Make any legal decisions C) Drink any alcoholic beverage   2) You may resume regular meals tomorrow.  Today it is better to start with liquids and gradually work up to solid foods.  You may eat anything you prefer, but it is better to start with liquids, then soup and crackers, and gradually work up to solid foods.   3) Please notify your doctor immediately if you have any unusual bleeding, trouble breathing, redness and pain at the surgery site, drainage, fever, or pain not relieved by medication.    4) Additional Instructions:        Please contact your physician with any problems or Same Day Surgery at [phone], Monday through Friday 6 am to 4 pm, or [place] at [facility] number at [phone].Conization of the Cervix Cervical conization is the cutting (excision) of a cone-shaped portion of the cervix. The procedure is performed through the vagina in either your health care provider's office or an operating room. This procedure is usually done when there is abnormal bleeding from the cervix. It can also be done to evaluate an abnormal Pap test or if an abnormality is seen on the cervix during an exam. The tissue is then examined to see if there are precancerous cells or cancer present.  Conization of the cervix is not done during a menstrual period or pregnancy.  LET [facility] CARE PROVIDER KNOW ABOUT:  Any allergies you have.   All medicines you are taking, including vitamins, herbs, eye drops, creams, and over-the-counter medicines.   Previous problems you or members of your family have had with the use of anesthetics.   Any blood disorders you have.   Previous surgeries you have had.   Medical conditions you have.   Your smoking habits.   The possibility of  being pregnant.  RISKS AND COMPLICATIONS  Generally, conization of the cervix is a safe procedure. However, as with any procedure, complications can occur. Possible complications include:  Heavy bleeding several days or weeks after the procedure. Light bleeding or spotting after the procedure is normal.  Infection (rare).  Damage to the cervix or surrounding organs (uncommon).   Problems with the anesthesia.   Increased risk of preterm labor in future pregnancies. BEFORE THE PROCEDURE  Do not eat or drink anything for 6-8 hours before the procedure.   Do not take aspirin or blood thinners for at least a week before the procedure or as directed by your health care provider.   Arrange for someone to take you home after the procedure.  PROCEDURE There are three different methods to perform conization of the cervix. These include:   The cold knife method. In this method a small cone-shaped sample of tissue is cut out with a knife (scalpel) from the cervical canal and the transformation zone (where the normal cells end and the abnormal cells begin).   The LEEP method. In this method a small cone-shaped sample of tissue is cut out with a thin wire that can burn (cauterize) the cervical tissue with an electrical current.   Laser treatment. In this method a small cone-shaped sample of tissue is cut out and then cauterized with a laser beam to prevent bleeding.  The procedure will be performed as follows:   Depending on the method,  you will either be given a medicine to make you sleep (general anesthetic) or a numbing medicine (local anesthetic). A medicine that numbs the cervix (cervical block) may be given.   A lubricated device called a speculum will be inserted into the vagina to spread open the walls of the vagina. This will help your health care provider see the inside of the vagina and cervix better.   The tissue from the cervix will be removed and examined.   The  results of the procedure will help your health care provider decide if further treatment is necessary. They will also help your health care provider decide on the best treatment if your results are abnormal. AFTER THE PROCEDURE  If you had a general anesthetic, you may be groggy for 2-3 hours after the procedure.   If you had a local anesthetic, you will rest at the clinic or hospital until you are stable and feel ready to go home.   Recovery may take up to 3 weeks.   You may have some cramping for about 1 week.   You may have bloody discharge or light bleeding for 1-2 weeks.   You may have black discharge coming from the vagina. This is from the paste used on the cervix to prevent bleeding. This is normal discharge.    This information is not intended to replace advice given to you by your health care provider. Make sure you discuss any questions you have with your health care provider.   Document Released: 10/16/2004 Document Revised: 01/11/2013 Document Reviewed: 07/02/2012 Elsevier Interactive Patient Education [employer].

## 2015-11-05 NOTE — Transfer of Care (Signed)
Immediate Anesthesia Transfer of Care Note  Patient: Denise Patrick  Procedure(s) Performed: Procedure(s): LOOP ELECTROSURGICAL EXCISION PROCEDURE (LEEP) (N/A)  Patient Location: PACU  Anesthesia Type:General  Level of Consciousness: awake  Airway & Oxygen Therapy: Patient connected to nasal cannula oxygen  Post-op Assessment: Post -op Vital signs reviewed and stable  Post vital signs: stable  Last Vitals:  Vitals:   11/05/15 0637 11/05/15 0838  BP: 115/66 97/78  Pulse: 91 90  Resp: 16 (!) 7  Temp: (!) 36.1 C 36.3 C    Last Pain:  Vitals:   11/05/15 0838  TempSrc: Temporal         Complications: No apparent anesthesia complications

## 2015-11-05 NOTE — Interval H&P Note (Signed)
History and Physical Interval Note:  11/05/2015 7:30 AM  Denise Patrick  has presented today for surgery, with the diagnosis of CIN3-CERVICAL DYSPLASIA  The various methods of treatment have been discussed with the patient and family. After consideration of risks, benefits and other options for treatment, the patient has consented to  Procedure(s): LOOP ELECTROSURGICAL EXCISION PROCEDURE (LEEP) (N/A) as a surgical intervention .  The patient's history has been reviewed, patient examined, no change in status, stable for surgery.  I have reviewed the patient's chart and labs.  Questions were answered to the patient's satisfaction.     Hassell Done A Tonae Livolsi

## 2015-11-05 NOTE — H&P (View-Only) (Signed)
Subjective:  PREOPERATIVE HISTORY AND PHYSICAL     Patient is a 35 y.o. G2P2031female scheduled forLEEP cone biopsy of the cervix for positive ECC of high-grade dysplasia on 11/05/2015. Colposcopy was inadequate; ECC was positive for high-grade dysplasia   Menstrual History: OB History    Gravida Para Term Preterm AB Living   2 2 2     2    SAB TAB Ectopic Multiple Live Births           2      Menarche age:NA Patient's last menstrual period was 10/25/2015.    Past Medical History:  Diagnosis Date  . Anxiety   . Depression     Past Surgical History:  Procedure Laterality Date  . KIDNEY STONE SURGERY    . TONSILLECTOMY      OB History  Gravida Para Term Preterm AB Living  2 2 2     2   SAB TAB Ectopic Multiple Live Births          2    # Outcome Date GA Lbr Len/2nd Weight Sex Delivery Anes PTL Lv  2 Term 2006   7 lb 1.8 oz (3.225 kg) F Vag-Spont   LIV  1 Term 2002   6 lb 14.4 oz (3.13 kg) F Vag-Spont   LIV      Social History   Social History  . Marital status: Single    Spouse name: N/A  . Number of children: N/A  . Years of education: N/A   Social History Main Topics  . Smoking status: Current Every Day Smoker    Packs/day: 0.50    Years: 15.00    Types: Cigarettes  . Smokeless tobacco: Current User  . Alcohol use No  . Drug use: No  . Sexual activity: Not Currently    Birth control/ protection: None   Other Topics Concern  . Not on file   Social History Narrative  . No narrative on file    Family History  Problem Relation Age of Onset  . Hypertension Father   . Cancer Father   . Cancer Maternal Grandmother   . COPD Maternal Grandmother      (Not in a hospital admission)  No Known Allergies  Review of Systems Constitutional: No recent fever/chills/sweats Respiratory: No recent cough/bronchitis Cardiovascular: No chest pain Gastrointestinal: No recent nausea/vomiting/diarrhea Genitourinary: No UTI symptoms Hematologic/lymphatic:No  history of coagulopathy or recent blood thinner use    Objective:    BP 101/66   Pulse 72   Ht 5\' 6"  (1.676 m)   Wt 193 lb 14.4 oz (88 kg)   LMP 10/25/2015   BMI 31.30 kg/m   General:   Normal  Skin:   normal  HEENT:  Normal  Neck:  Supple without Adenopathy or Thyromegaly  Lungs:   Heart:              Breasts:   Abdomen:  Pelvis:  M/S   Extremeties:  Neuro:    clear to auscultation bilaterally   Normal without murmur   Not Examined   soft, non-tender; bowel sounds normal; no masses,  no organomegaly   Exam deferred to OR  No CVAT  Warm/Dry   Normal          Assessment:    Positive ECC; high-grade endocervical dysplasia   Plan:  LEEP cone biopsy of cervix  Preop counseling: Patient is to undergo LEEP cone biopsy of cervix for high-grade endocervical dysplasia. She is understanding of the planned procedure  and is aware of and is accepting of all surgical risks which include but are not limited to bleeding, infection, pelvic organ injury with need for repair, blood clot disorders, anesthesia risks, and death. All questions are answered. Informed consent is given. Patient is ready and willing to proceed with surgery as scheduled.  Brayton Mars, MD  Note: This dictation was prepared with Dragon dictation along with smaller phrase technology. Any transcriptional errors that result from this process are unintentional.

## 2015-11-05 NOTE — Op Note (Signed)
OPERATIVE NOTE:  Denise Patrick PROCEDURE DATE: 11/05/2015   PREOPERATIVE DIAGNOSIS:  1. Endocervical dysplasia-high-grade  POSTOPERATIVE DIAGNOSIS:  1. Endocervical dysplasia-high-grade  PROCEDURE:  LEEP cone biopsy   SURGEON:  Brayton Mars, MD ASSISTANTS: PA-S Alla German ANESTHESIA: General INDICATIONS: 35 y.o. [id number] with endocervical dysplasia found on colposcopic directed biopsies. Patient presents for surgical management  FINDINGS:   Nonstaining Lugol's solution at 6:00 and 5:00 ectocervix   I/O's: No intake/output data recorded. COUNTS:  YES SPECIMENS:  1. Cervix 6:00 2. Ectocervical LEEP 3. Endocervical LEEP 4. Post LEEP ECC ANTIBIOTIC PROPHYLAXIS:N/A COMPLICATIONS: None immediate  PROCEDURE IN DETAIL: Patient was brought to the operating room placed in supine position. General anesthesia using the LMA technique was completed. She was placed in the dorsal lithotomy position using candycane stirrups. A Betadine external perineal prep and drape was performed in standard fashion. Timeout was completed. Bladder was drained of 150 cc of urine with red Robinson catheter.Coated speculum was placed in the vagina. Paracervical block is performed with 16 cc of lidocaine with 1-100,000 epinephrine. The vagina and cervix is anterior with Lugol's solution. The abnormal nonstaining area at 6:00 ectocervix and 5:00 ectocervix was noted. The ectocervical lesion at 6:00 and 5:00 was removed with a wide loop. This was followed by the ectocervical LEEP using a wide loop. The endocervical LEEP was completed using a narrower loop. A post LEEP ECC was performed with serrated curette. Rollerball cautery was used to maintain hemostasis within the cone bed. Astringent was applied. Patient was then mobilized and taken to recovery room in satisfactory condition. EBL-minimal; urine output-150 cc; IV fluids-per anesthesia  Hassell Done A. Zipporah Plants, MD, [facility]

## 2015-11-05 NOTE — Progress Notes (Signed)
Refused TED stockings due to c/o itching.  SCDs on entire leg.

## 2015-11-05 NOTE — Anesthesia Preprocedure Evaluation (Signed)
Anesthesia Evaluation  Patient identified by MRN, date of birth, ID band Patient awake    Reviewed: Allergy & Precautions, NPO status , Patient's Chart, lab work & pertinent test results  Airway Mallampati: II       Dental no notable dental hx.    Pulmonary Current Smoker,    Pulmonary exam normal        Cardiovascular negative cardio ROS Normal cardiovascular exam     Neuro/Psych  Headaches, PSYCHIATRIC DISORDERS Anxiety Depression    GI/Hepatic negative GI ROS, Neg liver ROS,   Endo/Other  negative endocrine ROS  Renal/GU stones  Female GU complaint     Musculoskeletal negative musculoskeletal ROS (+)   Abdominal Normal abdominal exam  (+)   Peds negative pediatric ROS (+)  Hematology negative hematology ROS (+)   Anesthesia Other Findings Cervical dysplasia  Reproductive/Obstetrics negative OB ROS                             Anesthesia Physical Anesthesia Plan  ASA: II  Anesthesia Plan: General   Post-op Pain Management:    Induction: Intravenous  Airway Management Planned: LMA  Additional Equipment:   Intra-op Plan:   Post-operative Plan: Extubation in OR  Informed Consent: I have reviewed the patients History and Physical, chart, labs and discussed the procedure including the risks, benefits and alternatives for the proposed anesthesia with the patient or authorized representative who has indicated his/her understanding and acceptance.   Dental advisory given  Plan Discussed with: CRNA and Surgeon  Anesthesia Plan Comments:         Anesthesia Quick Evaluation

## 2015-11-06 LAB — SURGICAL PATHOLOGY

## 2015-11-06 NOTE — Anesthesia Postprocedure Evaluation (Signed)
Anesthesia Post Note  Patient: Junella B Maxham  Procedure(s) Performed: Procedure(s) (LRB): LOOP ELECTROSURGICAL EXCISION PROCEDURE (LEEP) (N/A)  Patient location during evaluation: PACU Anesthesia Type: General Level of consciousness: awake and alert and oriented Pain management: pain level controlled Vital Signs Assessment: post-procedure vital signs reviewed and stable Respiratory status: spontaneous breathing Cardiovascular status: blood pressure returned to baseline Anesthetic complications: no    Last Vitals:  Vitals:   11/05/15 0943 11/05/15 1008  BP: (!) 130/58 113/63  Pulse:    Resp: 16 16  Temp:      Last Pain:  Vitals:   11/06/15 0829  TempSrc:   PainSc: 0-No pain                 Latoia Eyster

## 2015-11-11 ENCOUNTER — Encounter: Payer: Self-pay | Admitting: Emergency Medicine

## 2015-11-11 DIAGNOSIS — Z859 Personal history of malignant neoplasm, unspecified: Secondary | ICD-10-CM | POA: Insufficient documentation

## 2015-11-11 DIAGNOSIS — Z79899 Other long term (current) drug therapy: Secondary | ICD-10-CM | POA: Insufficient documentation

## 2015-11-11 DIAGNOSIS — Z5321 Procedure and treatment not carried out due to patient leaving prior to being seen by health care provider: Secondary | ICD-10-CM | POA: Diagnosis not present

## 2015-11-11 DIAGNOSIS — G8918 Other acute postprocedural pain: Secondary | ICD-10-CM | POA: Diagnosis not present

## 2015-11-11 NOTE — ED Triage Notes (Signed)
Pt reports that she has had the LEEP procedure Monday the 16th. Pt reports that was given Oxycodone 5mg  (12 tablets) and Motrin 800mg . Pt reports that she is finished with her Oxy and the Motrin is no longer helping with the pain. Pt also reports that she has had large clots x3 days. Pt is ambulatory with NAD noted at this time.

## 2015-11-12 ENCOUNTER — Emergency Department
Admission: EM | Admit: 2015-11-12 | Discharge: 2015-11-12 | Disposition: A | Discharge status: Home / Self Care | Payer: Medicaid Other | Attending: Emergency Medicine | Admitting: Emergency Medicine

## 2015-11-12 HISTORY — DX: Disorder of kidney and ureter, unspecified: N28.9

## 2015-11-12 HISTORY — DX: Malignant (primary) neoplasm, unspecified: C80.1

## 2015-11-12 LAB — CBC WITH DIFFERENTIAL/PLATELET
BASOS ABS: 0.1 10*3/uL (ref 0–0.1)
BASOS PCT: 1 %
Eosinophils Absolute: 0.3 10*3/uL (ref 0–0.7)
Eosinophils Relative: 3 %
HEMATOCRIT: 41.6 % (ref 35.0–47.0)
HEMOGLOBIN: 14.4 g/dL (ref 12.0–16.0)
LYMPHS PCT: 37 %
Lymphs Abs: 3.4 10*3/uL (ref 1.0–3.6)
MCH: 30.7 pg (ref 26.0–34.0)
MCHC: 34.5 g/dL (ref 32.0–36.0)
MCV: 89 fL (ref 80.0–100.0)
MONO ABS: 0.8 10*3/uL (ref 0.2–0.9)
MONOS PCT: 9 %
NEUTROS ABS: 4.6 10*3/uL (ref 1.4–6.5)
NEUTROS PCT: 50 %
Platelets: 325 10*3/uL (ref 150–440)
RBC: 4.68 MIL/uL (ref 3.80–5.20)
RDW: 13.3 % (ref 11.5–14.5)
WBC: 9.3 10*3/uL (ref 3.6–11.0)

## 2015-11-12 LAB — COMPREHENSIVE METABOLIC PANEL
ALBUMIN: 4.2 g/dL (ref 3.5–5.0)
ALK PHOS: 69 U/L (ref 38–126)
ALT: 28 U/L (ref 14–54)
AST: 23 U/L (ref 15–41)
Anion gap: 8 (ref 5–15)
BILIRUBIN TOTAL: 0.4 mg/dL (ref 0.3–1.2)
BUN: 24 mg/dL — AB (ref 6–20)
CALCIUM: 8.8 mg/dL — AB (ref 8.9–10.3)
CO2: 22 mmol/L (ref 22–32)
CREATININE: 1.04 mg/dL — AB (ref 0.44–1.00)
Chloride: 107 mmol/L (ref 101–111)
GFR calc Af Amer: >60 mL/min (ref >60)
GLUCOSE: 108 mg/dL — AB (ref 65–99)
Potassium: 3.4 mmol/L — ABNORMAL LOW (ref 3.5–5.1)
Sodium: 137 mmol/L (ref 135–145)
TOTAL PROTEIN: 7.6 g/dL (ref 6.5–8.1)

## 2015-11-13 ENCOUNTER — Ambulatory Visit (INDEPENDENT_AMBULATORY_CARE_PROVIDER_SITE_OTHER): Payer: Medicaid Other | Admitting: Obstetrics and Gynecology

## 2015-11-13 VITALS — BP 110/60 | Ht 66.0 in | Wt 196.4 lb

## 2015-11-13 DIAGNOSIS — R102 Pelvic and perineal pain: Secondary | ICD-10-CM | POA: Diagnosis not present

## 2015-11-13 DIAGNOSIS — Z09 Encounter for follow-up examination after completed treatment for conditions other than malignant neoplasm: Secondary | ICD-10-CM

## 2015-11-13 DIAGNOSIS — D069 Carcinoma in situ of cervix, unspecified: Secondary | ICD-10-CM

## 2015-11-13 LAB — POCT URINALYSIS DIPSTICK
BILIRUBIN UA: NEGATIVE
GLUCOSE UA: NEGATIVE
KETONES UA: NEGATIVE
Leukocytes, UA: NEGATIVE
NITRITE UA: NEGATIVE
PH UA: 6
Protein, UA: NEGATIVE
SPEC GRAV UA: 1.015
Urobilinogen, UA: NEGATIVE

## 2015-11-13 MED ORDER — NITROFURANTOIN MONOHYD MACRO 100 MG PO CAPS: 100.0000 mg | ORAL_CAPSULE | Freq: Two times a day (BID) | ORAL | 0 refills | Status: DC

## 2015-11-13 NOTE — Addendum Note (Signed)
Addended by: Elouise Munroe on: 11/13/2015 03:49 PM   Modules accepted: Orders

## 2015-11-13 NOTE — Patient Instructions (Signed)
1. Urinalysis and urine culture are obtained today to rule out bladder infection 2. Return in 3 months for ECC 3. No intercourse for 3 more weeks

## 2015-11-13 NOTE — Progress Notes (Signed)
Chief complaint: 1. One week postop check 2. Status post LEEP cone biopsy for endocervical high-grade dysplasia  Pathology from surgery: DIAGNOSIS:  A. CERVIX, 6 O'CLOCK; EXCISIONAL BIOPSY:  - HIGH-GRADE SQUAMOUS INTRAEPITHELIAL LESION (HSIL, CIN 2-3), 1 MM AREA,  EXTENDING TO ENDOCERVICAL MARGIN.   B. ECTOCERVIX; LEEP:  - HSIL (CIN 3), WITH ENDOCERVICAL GLAND INVOLVEMENT.  - HSIL EXTENDS TO ECTOCERVICAL AND ENDOCERVICAL MARGINS.  - DEEP MARGIN IS NEGATIVE FOR HSIL.   C. ENDOCERVIX; LEEP:  - HSIL (CIN 3), WITHOUT INVOLVEMENT OF ENDOCERVICAL GLANDS.  - HSIL EXTENDS TO ONE CAUTERIZED MARGIN IN LARGER PIECE, SEE COMMENT.   D. ENDOCERVIX; POST-LEEP CURETTINGS:  - ENDOCERVICAL MUCOSA WITH SQUAMOUS METAPLASIA.  - PROLIFERATIVE ENDOMETRIUM.  - NEGATIVE FOR SQUAMOUS INTRAEPITHELIAL LESION AND MALIGNANCY.   Comment:  The endocervical LEEP (C) was received in two pieces, and the larger  piece could not be reliably oriented. HSIL is seen only in the larger  piece, and it involves an inked cauterized margin. While this most  likely represents the ectocervical margin, close follow-up is  recommended.    Patient reports lower pelvic cramping with passage of greenish brown tissue and spotting over the past week. Pelvic discomfort continues.  OBJECTIVE: BP 110/60   Ht 5\' 6"  (1.676 m)   Wt 196 lb 6.4 oz (89.1 kg)   LMP 10/25/2015 (Within Weeks)   BMI 31.70 kg/m  Pleasant female in no acute distress Pelvic exam: Speculum-cone bed is healing with minimal discharge and spotting present; no evidence of active inflammation  ASSESSMENT: 1. History of high-grade endocervical dysplasia 2. LEEP cone biopsy and ectocervical biopsy consistent with CIN 2-3; possible positive margin 3. Cannot rule out possible UTI  PLAN: 1. Serial ECC every 3 months 2 then every 6 months 2 2. Urinalysis and urine culture  Brayton Mars, MD  Note: This dictation was prepared with Dragon dictation  along with smaller phrase technology. Any transcriptional errors that result from this process are unintentional.

## 2015-11-15 LAB — URINE CULTURE

## 2015-12-26 ENCOUNTER — Ambulatory Visit: Payer: Medicaid Other

## 2016-01-23 ENCOUNTER — Ambulatory Visit: Payer: Medicaid Other | Attending: Family Medicine

## 2016-01-23 DIAGNOSIS — M545 Low back pain: Secondary | ICD-10-CM | POA: Insufficient documentation

## 2016-01-23 DIAGNOSIS — G8929 Other chronic pain: Secondary | ICD-10-CM | POA: Diagnosis present

## 2016-01-23 NOTE — Patient Instructions (Signed)
Pelvic Tilt: Anterior - Legs Bent (Supine)    Rotate pelvis up and arch back. Hold _5___ seconds. Relax. Repeat __10__ times per set. Do _2___ sets per session. Do __2__ sessions per day.  Pelvic Tilt: Posterior - Legs Bent (Supine)    Tighten stomach and flatten back by rolling pelvis down. Hold ____ seconds. Relax. Repeat __10__ times per set. Do _2___ sets per session. Do __2__ sessions per day.  Concentric Contraction (Hook-Lying)    Lie with hips and knees bent. Slowly inhale, and then exhale. Pull navel toward spine. Hold for 10 seconds. Relax. Repeat __10__ times per set. Do _2___ sets per session. Do __2__ sessions per day.  Knee-to-Chest Stretch: Unilateral    With hand behind right knee, pull knee in to chest until a comfortable stretch is felt in lower back and buttocks. Keep back relaxed. Hold __30__ seconds. Repeat __3__ times per set. Do __2__ sessions per day.  Plank  ON ELBOWS    Support body on hands and elbows. Keep hips in line with torso and arms straight under chest. Avoid locking elbows. Hold for ____ breaths. Repeat with other leg. ADVANCED: Extend one leg up.  Abductor Strength: Side Plank Pose, on Knees    Press down with bottom knee to lift hips. Hold for 30 seconds. Repeat _3__ times each side. Perform 2 times/day  [employer] small of back into mat, maintain pelvic tilt, roll up one vertebrae at a time. Focus on engaging posterior hip muscles. Hold for 3 seconds. Repeat __10__ times. Relax and then repeat 2 more times. Perform 2 times/day

## 2016-01-23 NOTE — Therapy (Signed)
[facility] [address] [place], [address] Phone: [phone]   Fax:  [phone]  Physical Therapy Evaluation  Patient Details  Name: Denise Patrick MRN: [id number] Date of Birth: 1980/03/26 Referring Provider: Gennette Pac FNP  Encounter Date: 01/23/2016      PT End of Session - 01/23/16 1113    Visit Number 1   Number of Visits 1   Date for PT Re-Evaluation 01/30/16   Authorization Type medicaid   PT Start Time 0930   PT Stop Time 1030   PT Time Calculation (min) 60 min   Activity Tolerance Patient tolerated treatment well   Behavior During Therapy [facility] for tasks assessed/performed      Past Medical History:  Diagnosis Date  . Anxiety   . Cancer (HCC)    Cervical  . Depression   . Headache   . History of kidney stones   . Renal disorder     Past Surgical History:  Procedure Laterality Date  . KIDNEY STONE SURGERY    . LEEP N/A 11/05/2015   Procedure: LOOP ELECTROSURGICAL EXCISION PROCEDURE (LEEP);  Surgeon: Brayton Mars, MD;  Location: [facility] ORS;  Service: Gynecology;  Laterality: N/A;  . TONSILLECTOMY      There were no vitals filed for this visit.       Subjective Assessment - 01/23/16 1007    Subjective Chronic bilateral low back pain   Pertinent History Pt is here due to bilateral low back pain over the last month. Referral was generated in November so pain has been existent for at least 6 weeks. 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 Pain has worsened since that time and it intermittently runs down both her legs when she is standing. Pain extends down the front side of her thighs to her knees and all the way down the back of her legs to her feet. She reports some sudden buckling in her legs during hard labor a month ago. Pain worsens with both standing and sitting for an extended period. Pt also works in front  of a computer and states can only sit for approximately 20 minutes before she has to get up and walk around due to the pain. Denies N/T in bilateral LE or saddle paresthesia. Denies loss of bowel/bladder control. Pt reports that last imaging was in 2009. She has a history of "sciatic nerve pain" since 2006 with her second pregnancy. Pt had a back injury in 2008. She has a torn rotator cuff from 2015. Pt reports "really bad" back pain in May 2017, which improved with rest. Her current episode has been for the last month.   Limitations Walking;Sitting   How long can you sit comfortably? 20   How long can you stand comfortably? 5 minutes   How long can you walk comfortably? No pain   Diagnostic tests No imaging   Patient Stated Goals Get stronger   Currently in Pain? 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  Pain Relieving Factors changing positions, heating pad, ibuprofen   Multiple Pain Sites Yes  L shoulder pain, chronic            [facility] PT Assessment - 01/23/16 1011      Assessment   Medical Diagnosis Back pain Lumbar (M54.5)   Referring Provider Gennette Pac FNP   Onset Date/Surgical Date 12/11/15   Hand Dominance Right   Next MD Visit None scheduled   Prior Therapy None     Precautions   Precautions None     Restrictions   Weight Bearing Restrictions No     Balance Screen   Has the patient fallen in the past 6 months No   Has the patient had a decrease in activity level because of a fear of falling?  No   Is the patient reluctant to leave their home because of a fear of falling?  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 Sensation intact to light touch L2-S2 dermatomes. Denies saddle paresthesia. Reports pain with intercourse for an extended perior of time. Denies increase in low back or LE pain with intercourse. Encouraged to talk to GYN regarding these issues.      Posture/Postural Control   Posture Comments Excessive lumbar lordosis in standing. Forward head and rounded shoulders. Increased weight distribution in abdomen, hips, gluts. Hip IR/ER appears grossly WFL. Mild pain in R posterior hip with internal rotation     ROM / Strength   AROM / PROM / Strength Strength     Strength   Overall Strength Comments Hip flexion: 4+/5 bilateral, Knee flexion/extension and ankle DF: 5/5 bilateral;, HIp abduction, adduction, and extension 4-/5 bilateral. No clonus, rigidity, or spasticity. Pt unable to hold a full front or side plank for greater than 5 seconds with proper form. Able to perform a full double and single leg bridge without pain. Negative Ely test, HS approximately 85 degrees and painless bilateral. Negative slump and SLR bilateral.      Palpation   Palpation comment Pain with palpation along lumbar parspinals, multifidi, and R QL. Pain with CPA and UPA bilateral L3-L5 without reproduction of LE symptoms. Mild pain with palpation of R piriformis but no LE pain, negative on the L. No step-off palpated in lumbar spine     Transfers   Comments Independent with all transfers and ambulation          TREATMENT  Performed full HEP with  patient including: Ant/post pelvic tilts x 10 each; TrA contraction 10s hold x 10; Hooklying bridges x 10; Single knee to chest stretch 30s each; Front and side plants                 PT Education - 01/23/16 1112    Education provided Yes   Education Details HEP issued, plan of care and discharge. 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 PT examination reveals pain with palpation along bilateral paraspinals with notable spasm. Pain and hypomobility with posterior to anterior mobility testing at L3-L5 vertebrae. Excessive lumbar lordosis noted in standing with poor lumbar flexion mobility with forward bending. Poor hip and core strength with testing. Unable to reproduce radiating LE pain during evaluation on this date. Denies numbness/tingling in LEs or saddle area but does report painful intercourse. Encouraged pt to discuss with gynecologist. If this is determined to be musculoskeletal in nature it may be related to her low back pain and she could benefit from a referral to a pelvic health physical therapist at [facility]. 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 Unfortunately, Medicaid does not cover any follow-up treatment sessions and pt she cannot afford to pay out of pocket for further therapy services. We discussed some alternatives such as chiropractic care, which although not ideal for this specific patient, does generally have better coverage with Medicaid. Pt will be discharged on this date with a home exercise program and instructions to follow-up with her primary provider as needed.    Rehab Potential Good   Clinical Impairments Affecting Rehab Potential positive: age, motivation; negative: work schedule, payor source   PT Frequency One time visit   PT Duration --  1 week   PT Treatment/Interventions Aquatic Therapy;Biofeedback;Cryotherapy;Electrical Stimulation;Iontophoresis 4mg /ml Dexamethasone;Moist Heat;Traction;Ultrasound;DME Instruction;Gait training;Stair training;Therapeutic activities;Therapeutic exercise;Balance training;Neuromuscular re-education;Patient/family education;Manual techniques;Passive range of motion;Dry needling   PT Next Visit Plan Discharge   PT Home Exercise Plan Ant/post pelvic tilts, bridges, single knee to chest stretch, TrA contraction, prone planks, side planks    Consulted and Agree with Plan of Care Patient      Patient will benefit from skilled therapeutic intervention in order to improve the following deficits and impairments:  Postural dysfunction, Pain  Visit Diagnosis: Chronic bilateral low back pain without sciatica - Plan: PT plan of care cert/re-cert     Problem List Patient Active Problem List   Diagnosis Date Noted  . Severe dysplasia of cervix (CIN III) 09/27/2015  . Migraine 09/12/2015  . Endometriosis 09/06/2015  . Dysmenorrhea 09/06/2015  . ASCUS with positive high risk HPV 09/06/2015  . Tobacco user 09/06/2015  . Upper back pain 05/01/2015  . Vision changes 05/01/2015  . Tooth pain 05/01/2015   Phillips Grout PT, DPT    Huprich,Jason 01/23/2016, 11:51 AM  [facility] [address], [place], [address] Phone: [phone]   Fax:  [phone]  Name: Denise Patrick MRN: [id number] Date of Birth: 20-Jul-1980

## 2016-02-05 ENCOUNTER — Encounter: Payer: Medicaid Other | Admitting: Obstetrics and Gynecology

## 2016-02-12 ENCOUNTER — Encounter: Payer: Medicaid Other | Admitting: Obstetrics and Gynecology

## 2016-02-18 ENCOUNTER — Encounter: Payer: Self-pay | Admitting: *Deleted

## 2016-02-18 NOTE — Progress Notes (Signed)
Faxed renewal for recertification for [facility] medicaid to [employer].

## 2016-03-12 ENCOUNTER — Ambulatory Visit (INDEPENDENT_AMBULATORY_CARE_PROVIDER_SITE_OTHER): Payer: Medicaid Other | Admitting: Obstetrics and Gynecology

## 2016-03-12 ENCOUNTER — Encounter: Payer: Self-pay | Admitting: Obstetrics and Gynecology

## 2016-03-12 VITALS — BP 117/75 | HR 102 | Ht 66.0 in | Wt 184.8 lb

## 2016-03-12 DIAGNOSIS — D069 Carcinoma in situ of cervix, unspecified: Secondary | ICD-10-CM | POA: Diagnosis not present

## 2016-03-12 DIAGNOSIS — R635 Abnormal weight gain: Secondary | ICD-10-CM

## 2016-03-12 DIAGNOSIS — Z202 Contact with and (suspected) exposure to infections with a predominantly sexual mode of transmission: Secondary | ICD-10-CM

## 2016-03-12 LAB — POCT URINE PREGNANCY: PREG TEST UR: NEGATIVE

## 2016-03-12 NOTE — Patient Instructions (Signed)
1. ECC is performed today to check for dysplasia 2. STD screening is performed today 3. If testing is normal, return in 1 year for Pap smear

## 2016-03-12 NOTE — Progress Notes (Signed)
Chief complaint: 1. History of severe dysplasia, status post LEEP cone biopsy 2. Possible STD exposure  OBJECTIVE: Patient returns for ECC following the LEEP cone biopsy for high-grade dysplasia with a possible positive endocervical margin. Patient is sexually active and is intermittently using condoms.  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 CERVIX, 6 O'CLOCK; EXCISIONAL BIOPSY:  - HIGH-GRADE SQUAMOUS INTRAEPITHELIAL LESION (HSIL, CIN 2-3), 1 MM AREA,  EXTENDING TO ENDOCERVICAL MARGIN.  B. ECTOCERVIX; LEEP:  - HSIL (CIN 3), WITH ENDOCERVICAL GLAND INVOLVEMENT.  - HSIL EXTENDS TO ECTOCERVICAL AND ENDOCERVICAL MARGINS.  - DEEP MARGIN IS NEGATIVE FOR HSIL.  C. ENDOCERVIX; LEEP:  - HSIL (CIN 3), WITHOUT INVOLVEMENT OF ENDOCERVICAL GLANDS.  - HSIL EXTENDS TO ONE CAUTERIZED MARGIN IN LARGER PIECE, SEE COMMENT.  D. ENDOCERVIX; POST-LEEP CURETTINGS:  - ENDOCERVICAL MUCOSA WITH SQUAMOUS METAPLASIA.  - PROLIFERATIVE ENDOMETRIUM.  - NEGATIVE FOR SQUAMOUS INTRAEPITHELIAL LESION AND MALIGNANCY.   03/12/2016 ECC-performed   OBJECTIVE: Pleasant female in no acute distress Pelvic exam: External genitalia-normal BUS-normal Vagina-normal Cervix-no lesions; friable with ECC procedure Uterus-not examined Adnexa-not examined RV-normal external exam  ASSESSMENT: 1. History of CIN 2-3 2. Status post LEEP cone biopsy on 11/05/2015 with possible positive endocervical margin on endocervical LEEP 3. Loss of insurance  PLAN: 1. ECC 2. Return in 1 year for Pap smear if the ECC is negative 3. Continue with condom use 4. STI panel is drawn; results will be made available  A total of 15 minutes were spent face-to-face with the patient during this encounter and  over half of that time dealt with counseling and coordination of care.  Brayton Mars, MD  Note: This dictation was prepared with Dragon dictation along with smaller phrase technology. Any transcriptional errors that result from this process are unintentional.

## 2016-03-13 ENCOUNTER — Encounter: Payer: Self-pay | Admitting: Obstetrics and Gynecology

## 2016-03-13 DIAGNOSIS — R768 Other specified abnormal immunological findings in serum: Secondary | ICD-10-CM | POA: Insufficient documentation

## 2016-03-13 LAB — HEPATITIS B SURFACE ANTIGEN: Hepatitis B Surface Ag: NEGATIVE

## 2016-03-13 LAB — HEPATITIS C ANTIBODY: Hep C Virus Ab: <0.1 s/co ratio (ref 0.0–0.9)

## 2016-03-13 LAB — TSH: TSH: 1.63 u[IU]/mL (ref 0.450–4.500)

## 2016-03-13 LAB — HSV(HERPES SIMPLEX VRS) I + II AB-IGG
HSV 1 Glycoprotein G Ab, IgG: 27.4 index — ABNORMAL HIGH (ref 0.00–0.90)
HSV 2 GLYCOPROTEIN G AB, IGG: 7 {index} — AB (ref 0.00–0.90)

## 2016-03-13 LAB — HIV ANTIBODY (ROUTINE TESTING W REFLEX): HIV Screen 4th Generation wRfx: NONREACTIVE

## 2016-03-13 LAB — RPR: RPR Ser Ql: NONREACTIVE

## 2016-03-16 LAB — GC/CHLAMYDIA PROBE AMP
Chlamydia trachomatis, NAA: NEGATIVE
NEISSERIA GONORRHOEAE BY PCR: NEGATIVE

## 2016-03-17 LAB — PATHOLOGY

## 2016-05-16 ENCOUNTER — Ambulatory Visit: Payer: Self-pay | Admitting: Family Medicine

## 2016-08-01 ENCOUNTER — Encounter: Payer: Self-pay | Admitting: Emergency Medicine

## 2016-08-01 ENCOUNTER — Emergency Department: Payer: Medicaid Other

## 2016-08-01 ENCOUNTER — Emergency Department
Admission: EM | Admit: 2016-08-01 | Discharge: 2016-08-01 | Disposition: A | Discharge status: Home / Self Care | Payer: Medicaid Other | Attending: Emergency Medicine | Admitting: Emergency Medicine

## 2016-08-01 DIAGNOSIS — F17210 Nicotine dependence, cigarettes, uncomplicated: Secondary | ICD-10-CM | POA: Diagnosis not present

## 2016-08-01 DIAGNOSIS — Z79899 Other long term (current) drug therapy: Secondary | ICD-10-CM | POA: Insufficient documentation

## 2016-08-01 DIAGNOSIS — M791 Myalgia: Secondary | ICD-10-CM | POA: Diagnosis present

## 2016-08-01 DIAGNOSIS — R6889 Other general symptoms and signs: Secondary | ICD-10-CM

## 2016-08-01 DIAGNOSIS — J111 Influenza due to unidentified influenza virus with other respiratory manifestations: Secondary | ICD-10-CM | POA: Diagnosis not present

## 2016-08-01 LAB — COMPREHENSIVE METABOLIC PANEL
ALBUMIN: 4.3 g/dL (ref 3.5–5.0)
ALT: 18 U/L (ref 14–54)
ANION GAP: 7 (ref 5–15)
AST: 20 U/L (ref 15–41)
Alkaline Phosphatase: 66 U/L (ref 38–126)
BUN: 12 mg/dL (ref 6–20)
CO2: 28 mmol/L (ref 22–32)
Calcium: 9.1 mg/dL (ref 8.9–10.3)
Chloride: 105 mmol/L (ref 101–111)
Creatinine, Ser: 0.95 mg/dL (ref 0.44–1.00)
GFR calc Af Amer: >60 mL/min (ref >60)
GFR calc non Af Amer: >60 mL/min (ref >60)
GLUCOSE: 112 mg/dL — AB (ref 65–99)
POTASSIUM: 3.9 mmol/L (ref 3.5–5.1)
SODIUM: 140 mmol/L (ref 135–145)
Total Bilirubin: 0.4 mg/dL (ref 0.3–1.2)
Total Protein: 7.4 g/dL (ref 6.5–8.1)

## 2016-08-01 LAB — URINALYSIS, COMPLETE (UACMP) WITH MICROSCOPIC
Bilirubin Urine: NEGATIVE
Glucose, UA: NEGATIVE mg/dL
KETONES UR: NEGATIVE mg/dL
Leukocytes, UA: NEGATIVE
Nitrite: NEGATIVE
PROTEIN: NEGATIVE mg/dL
Specific Gravity, Urine: 1.017 (ref 1.005–1.030)
pH: 7 (ref 5.0–8.0)

## 2016-08-01 LAB — CK: Total CK: 57 U/L (ref 38–234)

## 2016-08-01 LAB — CBC
HCT: 40.2 % (ref 35.0–47.0)
HEMOGLOBIN: 14 g/dL (ref 12.0–16.0)
MCH: 30.7 pg (ref 26.0–34.0)
MCHC: 34.7 g/dL (ref 32.0–36.0)
MCV: 88.3 fL (ref 80.0–100.0)
Platelets: 349 10*3/uL (ref 150–440)
RBC: 4.55 MIL/uL (ref 3.80–5.20)
RDW: 13 % (ref 11.5–14.5)
WBC: 9.4 10*3/uL (ref 3.6–11.0)

## 2016-08-01 LAB — LIPASE, BLOOD: Lipase: 23 U/L (ref 11–51)

## 2016-08-01 LAB — PREGNANCY, URINE: PREG TEST UR: NEGATIVE

## 2016-08-01 LAB — TSH: TSH: 0.67 u[IU]/mL (ref 0.350–4.500)

## 2016-08-01 MED ORDER — HYDROXYZINE HCL 10 MG PO TABS: 10.0000 mg | ORAL_TABLET | Freq: Three times a day (TID) | ORAL | 0 refills | Status: DC | PRN

## 2016-08-01 NOTE — ED Notes (Signed)
During discharge patient stated "I already have that you don't need to go over it".  Patient verbally upset stating she doesn't feel like she received full care today.  This nurse attempting to ask patient what else this nurse could do to help or get for her to talk to.  Patient cutting this nurse off stating she would "just go to urgent care".  Nurse was apologetic but patient walking out of room while talking and refused discharge vitals and signature.
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
## 2016-08-01 NOTE — ED Notes (Signed)
I went to the patients room to perform another blood draw. I asked didn't you just have an IV established and she stated "yes, I just took it out myself." I asked why and she said with a stern voice "because."   I told her that I would have to straight stick her for the blood and also with attitude she did agree for me to do it. When I went to scan her arm band for identification she had no arm band on. She took it off and threw it into the trash.  I went and got one of her stickers from the PA to scan that. No other incidents with this patient.

## 2016-08-01 NOTE — ED Triage Notes (Signed)
Pt with body aches for two weeks, wants to be checked for lymes disease.

## 2016-08-02 LAB — B. BURGDORFI ANTIBODIES

## 2016-08-04 LAB — URINE CULTURE

## 2016-08-06 NOTE — Progress Notes (Signed)
36 y/o F d/c from ED with flu-like symptoms. Urine culture resulted with E coli for which Dr. Aundria Rud authorized Keflex 500 mg qid x 5 days. Attempted to call patient with no answer and left VM 7/18.   Ulice Dash, PharmD Clinical Pharmacist

## 2016-08-07 ENCOUNTER — Telehealth: Payer: Self-pay | Admitting: Emergency Medicine

## 2016-08-07 NOTE — Telephone Encounter (Signed)
Called patient to inform of test results for std.  Left message.

## 2016-09-28 ENCOUNTER — Emergency Department: Admission: EM | Admit: 2016-09-28 | Discharge: 2016-09-28 | Discharge status: Against Medical Advice | Payer: Self-pay

## 2016-12-06 ENCOUNTER — Emergency Department (HOSPITAL_COMMUNITY): Admission: EM | Admit: 2016-12-06 | Discharge: 2016-12-06 | Discharge status: Against Medical Advice | Payer: Self-pay

## 2016-12-06 NOTE — ED Notes (Signed)
Patient handed registration her bracelet and stickers and stated "I think I can live. I'm leaving."

## 2017-02-24 ENCOUNTER — Encounter: Payer: Medicaid Other | Admitting: Obstetrics and Gynecology

## 2017-03-04 ENCOUNTER — Ambulatory Visit (INDEPENDENT_AMBULATORY_CARE_PROVIDER_SITE_OTHER): Payer: Medicaid Other | Admitting: Obstetrics and Gynecology

## 2017-03-04 ENCOUNTER — Encounter: Payer: Self-pay | Admitting: Obstetrics and Gynecology

## 2017-03-04 VITALS — BP 111/75 | HR 101 | Ht 66.0 in | Wt 201.2 lb

## 2017-03-04 DIAGNOSIS — N912 Amenorrhea, unspecified: Secondary | ICD-10-CM | POA: Diagnosis not present

## 2017-03-04 DIAGNOSIS — R300 Dysuria: Secondary | ICD-10-CM

## 2017-03-04 DIAGNOSIS — D069 Carcinoma in situ of cervix, unspecified: Secondary | ICD-10-CM | POA: Diagnosis not present

## 2017-03-04 DIAGNOSIS — Z3201 Encounter for pregnancy test, result positive: Secondary | ICD-10-CM | POA: Diagnosis not present

## 2017-03-04 LAB — POCT URINALYSIS DIPSTICK
Bilirubin, UA: NEGATIVE
Glucose, UA: NEGATIVE
KETONES UA: NEGATIVE
NITRITE UA: NEGATIVE
ODOR: NEGATIVE
PH UA: 6.5 (ref 5.0–8.0)
Spec Grav, UA: 1.015 (ref 1.010–1.025)
UROBILINOGEN UA: 0.2 U/dL

## 2017-03-04 LAB — POCT URINE PREGNANCY: Preg Test, Ur: POSITIVE — AB

## 2017-03-04 MED ORDER — METOCLOPRAMIDE HCL 10 MG PO TABS: 10.0000 mg | ORAL_TABLET | Freq: Four times a day (QID) | ORAL | 1 refills | Status: AC

## 2017-03-04 MED ORDER — DOXYLAMINE-PYRIDOXINE 10-10 MG PO TBEC: 10.0000 mg | DELAYED_RELEASE_TABLET | Freq: Every day | ORAL | 1 refills | Status: DC

## 2017-03-04 NOTE — Progress Notes (Signed)
Chief complaint: 1.  Pap smear 2.  History of severe dysplasia, status post LEEP cone biopsy 3.  Amenorrhea  Denise Patrick presents today for Pap smear done in follow-up of LEEP cone biopsy of the cervix for severe dysplasia.  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 CERVIX, 6 O'CLOCK; EXCISIONAL BIOPSY:  - HIGH-GRADE SQUAMOUS INTRAEPITHELIAL LESION (HSIL, CIN 2-3), 1 MM AREA,  EXTENDING TO ENDOCERVICAL MARGIN.  B. ECTOCERVIX; LEEP:  - HSIL (CIN 3), WITH ENDOCERVICAL GLAND INVOLVEMENT.  - HSIL EXTENDS TO ECTOCERVICAL AND ENDOCERVICAL MARGINS.  - DEEP MARGIN IS NEGATIVE FOR HSIL.  C. ENDOCERVIX; LEEP:  - HSIL (CIN 3), WITHOUT INVOLVEMENT OF ENDOCERVICAL GLANDS.  - HSIL EXTENDS TO ONE CAUTERIZED MARGIN IN LARGER PIECE, SEE COMMENT.  D. ENDOCERVIX; POST-LEEP CURETTINGS:  - ENDOCERVICAL MUCOSA WITH SQUAMOUS METAPLASIA.  - PROLIFERATIVE ENDOMETRIUM.  - NEGATIVE FOR SQUAMOUS INTRAEPITHELIAL LESION AND MALIGNANCY.  03/12/2016 ECC-negative for dysplasia 03/04/2017 Pap smear-performed  Denise Patrick reports last menstrual period to be 01/22/2017 for 01/29/2017.  She had several positive home pregnancy tests and took 2 Plan B medications unsuccessfully. She reports no vaginal bleeding, vaginal discharge, or pelvic pain.  She does report nausea and breast tenderness. She also reports significant migraine headaches that are not responding to Tylenol.  Past Medical History:  Diagnosis Date  . Anxiety   . Cancer (HCC)    Cervical  . Depression   . Headache   . History of kidney stones   . Renal disorder    Past Surgical History:  Procedure Laterality Date  . KIDNEY STONE SURGERY    . LEEP N/A 11/05/2015   Procedure: LOOP  ELECTROSURGICAL EXCISION PROCEDURE (LEEP);  Surgeon: Brayton Mars, MD;  Location: [facility] ORS;  Service: Gynecology;  Laterality: N/A;  . TONSILLECTOMY     OBJECTIVE: BP 111/75   Pulse (!) 101   Ht 5\' 6"  (1.676 m)   Wt 201 lb 3.2 oz (91.3 kg)   LMP 01/22/2017 (Approximate)   BMI 32.47 kg/m  Pleasant anxious female, tearful during history. Abdomen: Soft, nontender; no organomegaly Pelvic exam: External genitalia-normal BUS-normal Vagina-normal Cervix-no lesions; no discharge; Pap smear is performed Uterus-midplane, top normal size, mobile, nontender Adnexa-nonpalpable nontender Rectovaginal-normal external exam Extremities: Warm and dry  ASSESSMENT: 1.  History of high-grade dysplasia, status post LEEP cone biopsy 2.  Amenorrhea, positive pregnancy tests, and no success with plan B therapy 3.  Nausea and breast tenderness  PLAN: 1.  Pap smear 2.  Begin Diclegis 2 tabs at night and 1 twice a day 3.  Pelvic ultrasound-first available to confirm EDC and viability 4.  Repeat Pap smear in 1 year unless abnormality 5.  Pregnancy options were reviewed in detail; patient may return as needed for counseling 6.  Take acetaminophen 1000 mg and Reglan 10 mg 4 times a day for migraine headaches  A total of 15 minutes were spent face-to-face with the patient during this encounter and over half of that time dealt with counseling and coordination of care.  Brayton Mars, MD  Note: This dictation was prepared  with Dragon dictation along with smaller phrase technology. Any transcriptional errors that result from this process are unintentional.

## 2017-03-04 NOTE — Patient Instructions (Addendum)
1.  Pap smear is done today 2.  Ultrasound is scheduled to assess EDC of current pregnancy and viability 3.  Diclegis  2 tablets at night and 1 twice a day for nausea 4.  Begin extra strength Tylenol 2 tablets every 6 hours and Reglan 10 mg every 6 hours for migraine headaches.  Take the Reglan and Tylenol at the same time.

## 2017-03-05 ENCOUNTER — Other Ambulatory Visit: Payer: Self-pay

## 2017-03-05 ENCOUNTER — Ambulatory Visit (INDEPENDENT_AMBULATORY_CARE_PROVIDER_SITE_OTHER): Payer: Medicaid Other

## 2017-03-05 DIAGNOSIS — N912 Amenorrhea, unspecified: Secondary | ICD-10-CM | POA: Diagnosis not present

## 2017-03-05 DIAGNOSIS — Z3687 Encounter for antenatal screening for uncertain dates: Secondary | ICD-10-CM

## 2017-03-08 ENCOUNTER — Other Ambulatory Visit: Payer: Self-pay | Admitting: Obstetrics and Gynecology

## 2017-03-08 DIAGNOSIS — N390 Urinary tract infection, site not specified: Secondary | ICD-10-CM | POA: Insufficient documentation

## 2017-03-08 LAB — URINE CULTURE

## 2017-03-08 MED ORDER — NITROFURANTOIN MONOHYD MACRO 100 MG PO CAPS: 100.0000 mg | ORAL_CAPSULE | Freq: Two times a day (BID) | ORAL | 1 refills | Status: DC

## 2017-03-12 ENCOUNTER — Telehealth: Payer: Self-pay | Admitting: Obstetrics and Gynecology

## 2017-03-12 LAB — IGP, COBASHPV16/18
HPV 16: NEGATIVE
HPV 18: NEGATIVE
HPV OTHER HR TYPES: NEGATIVE
PAP SMEAR COMMENT: 0

## 2017-03-12 NOTE — Telephone Encounter (Signed)
The patient called and stated that she would like to speak with Joyice Faster in regards to her not having her results disclosed to her yet. The patient was upset. I informed the patient that a nurse will call her as soon as they can. Please advise.

## 2017-03-13 ENCOUNTER — Telehealth: Payer: Self-pay | Admitting: Obstetrics and Gynecology

## 2017-03-13 MED ORDER — NITROFURANTOIN MONOHYD MACRO 100 MG PO CAPS: 100.0000 mg | ORAL_CAPSULE | Freq: Two times a day (BID) | ORAL | 1 refills | Status: DC

## 2017-03-13 NOTE — Telephone Encounter (Signed)
The patient called and state that she would like for [employer] to send in her prescription to [facility] in [place] and not the medication management "center" The patient would like to speak with Joyice Faster "as soon as possible". Please advise.

## 2017-03-13 NOTE — Telephone Encounter (Signed)
Pt aware pap wnl. Informed of pos cns- med erx on 2/17 by mad.

## 2017-03-13 NOTE — Telephone Encounter (Signed)
Pt aware per vm. Med erx to [facility] in [place].

## 2017-03-17 ENCOUNTER — Encounter: Payer: Medicaid Other | Admitting: Obstetrics and Gynecology

## 2017-03-19 ENCOUNTER — Emergency Department: Payer: Medicaid Other

## 2017-03-19 ENCOUNTER — Emergency Department
Admission: EM | Admit: 2017-03-19 | Discharge: 2017-03-19 | Disposition: A | Discharge status: Home / Self Care | Payer: Medicaid Other | Attending: Emergency Medicine | Admitting: Emergency Medicine

## 2017-03-19 ENCOUNTER — Encounter: Payer: Self-pay | Admitting: Emergency Medicine

## 2017-03-19 ENCOUNTER — Other Ambulatory Visit: Payer: Self-pay

## 2017-03-19 ENCOUNTER — Ambulatory Visit
Admission: EM | Admit: 2017-03-19 | Discharge: 2017-03-19 | Disposition: A | Discharge status: Home / Self Care | Payer: Medicaid Other | Attending: Family Medicine | Admitting: Family Medicine

## 2017-03-19 ENCOUNTER — Other Ambulatory Visit: Payer: Medicaid Other

## 2017-03-19 DIAGNOSIS — G43909 Migraine, unspecified, not intractable, without status migrainosus: Secondary | ICD-10-CM | POA: Diagnosis present

## 2017-03-19 DIAGNOSIS — G43901 Migraine, unspecified, not intractable, with status migrainosus: Secondary | ICD-10-CM | POA: Insufficient documentation

## 2017-03-19 DIAGNOSIS — Z3201 Encounter for pregnancy test, result positive: Secondary | ICD-10-CM

## 2017-03-19 DIAGNOSIS — Z87891 Personal history of nicotine dependence: Secondary | ICD-10-CM | POA: Insufficient documentation

## 2017-03-19 DIAGNOSIS — G44209 Tension-type headache, unspecified, not intractable: Secondary | ICD-10-CM | POA: Diagnosis not present

## 2017-03-19 DIAGNOSIS — G43801 Other migraine, not intractable, with status migrainosus: Secondary | ICD-10-CM

## 2017-03-19 LAB — CBC
HCT: 38.4 % (ref 35.0–47.0)
HEMOGLOBIN: 12.9 g/dL (ref 12.0–16.0)
MCH: 30 pg (ref 26.0–34.0)
MCHC: 33.5 g/dL (ref 32.0–36.0)
MCV: 89.4 fL (ref 80.0–100.0)
Platelets: 461 10*3/uL — ABNORMAL HIGH (ref 150–440)
RBC: 4.3 MIL/uL (ref 3.80–5.20)
RDW: 13.4 % (ref 11.5–14.5)
WBC: 9.4 10*3/uL (ref 3.6–11.0)

## 2017-03-19 LAB — URINALYSIS, COMPLETE (UACMP) WITH MICROSCOPIC
BILIRUBIN URINE: NEGATIVE
GLUCOSE, UA: NEGATIVE mg/dL
KETONES UR: NEGATIVE mg/dL
LEUKOCYTES UA: NEGATIVE
Nitrite: NEGATIVE
PH: 6 (ref 5.0–8.0)
PROTEIN: NEGATIVE mg/dL
Specific Gravity, Urine: 1.024 (ref 1.005–1.030)

## 2017-03-19 LAB — BASIC METABOLIC PANEL
Anion gap: 9 (ref 5–15)
BUN: 21 mg/dL — AB (ref 6–20)
CHLORIDE: 110 mmol/L (ref 101–111)
CO2: 20 mmol/L — ABNORMAL LOW (ref 22–32)
Calcium: 9 mg/dL (ref 8.9–10.3)
Creatinine, Ser: 0.93 mg/dL (ref 0.44–1.00)
GFR calc non Af Amer: >60 mL/min (ref >60)
Glucose, Bld: 145 mg/dL — ABNORMAL HIGH (ref 65–99)
Potassium: 3.9 mmol/L (ref 3.5–5.1)
SODIUM: 139 mmol/L (ref 135–145)

## 2017-03-19 LAB — POC URINE PREG, ED: Preg Test, Ur: POSITIVE — AB

## 2017-03-19 MED ORDER — HYDROMORPHONE HCL 1 MG/ML IJ SOLN
1.0000 mg | Freq: Once | INTRAMUSCULAR | Status: AC
  Administered 2017-03-19: 1 mg via INTRAVENOUS
  Filled 2017-03-19: qty 1

## 2017-03-19 MED ORDER — SODIUM CHLORIDE 0.9 % IV BOLUS (SEPSIS)
1000.0000 mL | Freq: Once | INTRAVENOUS | Status: AC
  Administered 2017-03-19: 1000 mL via INTRAVENOUS

## 2017-03-19 MED ORDER — DEXAMETHASONE SODIUM PHOSPHATE 10 MG/ML IJ SOLN
10.0000 mg | Freq: Once | INTRAMUSCULAR | Status: AC
  Administered 2017-03-19: 10 mg via INTRAMUSCULAR

## 2017-03-19 MED ORDER — DIPHENHYDRAMINE HCL 50 MG/ML IJ SOLN
50.0000 mg | Freq: Once | INTRAMUSCULAR | Status: AC
  Administered 2017-03-19: 50 mg via INTRAVENOUS
  Filled 2017-03-19: qty 1

## 2017-03-19 MED ORDER — BUTALBITAL-APAP-CAFFEINE 50-325-40 MG PO TABS: 1.0000 | ORAL_TABLET | Freq: Four times a day (QID) | ORAL | 0 refills | Status: DC | PRN

## 2017-03-19 MED ORDER — PROCHLORPERAZINE MALEATE 5 MG PO TABS: 5.0000 mg | ORAL_TABLET | Freq: Four times a day (QID) | ORAL | 0 refills | Status: DC | PRN

## 2017-03-19 MED ORDER — KETOROLAC TROMETHAMINE 60 MG/2ML IM SOLN
60.0000 mg | Freq: Once | INTRAMUSCULAR | Status: AC
  Administered 2017-03-19: 60 mg via INTRAMUSCULAR

## 2017-03-19 MED ORDER — PROCHLORPERAZINE EDISYLATE 5 MG/ML IJ SOLN
10.0000 mg | Freq: Once | INTRAMUSCULAR | Status: AC
  Administered 2017-03-19: 10 mg via INTRAVENOUS
  Filled 2017-03-19: qty 2

## 2017-03-19 MED ORDER — KETOROLAC TROMETHAMINE 10 MG PO TABS: 10.0000 mg | ORAL_TABLET | Freq: Three times a day (TID) | ORAL | 0 refills | Status: DC | PRN

## 2017-03-19 MED ORDER — ONDANSETRON 8 MG PO TBDP
8.0000 mg | ORAL_TABLET | Freq: Once | ORAL | Status: AC
  Administered 2017-03-19: 8 mg via ORAL

## 2017-03-19 NOTE — ED Triage Notes (Signed)
Patient complains of migraines that started in November. Patient states that her pyschiatrist placed her on Topamax and she hasn't been taking it because it doesn't help. Patient reports GYN placed her on Reglan without relief. Patient states that this migraine episode started last night. Pt states that pain is in the front of head and back of head with dizziness and nausea.

## 2017-03-19 NOTE — ED Provider Notes (Signed)
[facility] Emergency Department Provider Note  ____________________________________________  Time seen: Approximately 9:30 PM  I have reviewed the triage vital signs and the nursing notes.   HISTORY  Chief Complaint Headache    HPI Denise Patrick is a 37 y.o. female with a history of depression, recent chronic headaches, presenting for migraine.  The patient reports that since November, she has been having migraines that she describes as tension in the neck with a pressure sensation that radiates up the scalp and to behind her eyes.  She has associated photophobia, photosensitivity, and nausea without vomiting.  Initially, the headaches were occurring every few weeks, but over the last 3-4 weeks, she has been experiencing headaches 2-3-times weekly.  She has been taking Fioricet and Topamax without any improvement.  Today, she presents to the emergency department after 2 days of progressively worsening typical headache.  She received Decadron, Toradol, and Zofran with improvement of her neck symptoms but no change in her headache.  She denies any trauma, fever, or tick bites.  No rash.  She does report that her headaches have coincided with a significant increase in life stressors.  Past Medical History:  Diagnosis Date  . Anxiety   . Cancer (HCC)    Cervical  . Depression   . Headache   . History of kidney stones   . Renal disorder     Patient Active Problem List   Diagnosis Date Noted  . UTI (urinary tract infection) 03/08/2017  . Positive urine pregnancy test 03/04/2017  . 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 Severe dysplasia of cervix (CIN III) 09/27/2015  . Migraine 09/12/2015  . Endometriosis 09/06/2015  . Dysmenorrhea 09/06/2015  . ASCUS with positive high risk HPV 09/06/2015  . Tobacco user 09/06/2015  . Upper back pain 05/01/2015  . Vision changes 05/01/2015  . Tooth pain 05/01/2015    Past Surgical History:  Procedure Laterality Date  .  KIDNEY STONE SURGERY    . LEEP N/A 11/05/2015   Procedure: LOOP ELECTROSURGICAL EXCISION PROCEDURE (LEEP);  Surgeon: Brayton Mars, MD;  Location: [facility] ORS;  Service: Gynecology;  Laterality: N/A;  . TONSILLECTOMY      Current Outpatient Rx  . Order #: [id number] Class: Normal  . Order #: [id number] Class: Historical Med  . Order #: [id number] Class: Normal  . Order #: [id number] Class: Historical Med    Allergies Patient has no known allergies.  Family History  Problem Relation Age of Onset  . Hypertension Father   . Cancer Father   . Cancer Maternal Grandmother   . COPD Maternal Grandmother     Social History Social History   Tobacco Use  . Smoking status: Former Smoker    Packs/day: 0.50    Years: 15.00    Pack years: 7.50    Types: Cigarettes  . Smokeless tobacco: Never Used  Substance Use Topics  . Alcohol use: No  . Drug use: No    Review of Systems Constitutional: No fever/chills.  No lightheadedness or syncope. Eyes: No visual changes.  Photo sensitivity but no blurred or double vision. ENT: No sore throat. No congestion or rhinorrhea. Cardiovascular: Denies chest pain. Denies palpitations. Respiratory: Denies shortness of breath.  No cough. Gastrointestinal: No abdominal pain.  No nausea, no vomiting.  No diarrhea.  No constipation. Genitourinary: Negative for dysuria. Musculoskeletal: Negative for back pain.  Positive for neck pressure. Skin: Negative for rash. Neurological: Positive for chronic headaches.. No focal numbness, tingling or weakness.  Changes in vision speech or  mental status.  No difficulty walking.    ____________________________________________   PHYSICAL EXAM:  VITAL SIGNS: ED Triage Vitals  Enc Vitals Group     BP 03/19/17 2012 124/67     Pulse Rate 03/19/17 2012 85     Resp 03/19/17 2012 18     Temp 03/19/17 2012 99.2 F (37.3 C)     Temp Source 03/19/17 2012 Oral     SpO2 03/19/17 2012 96 %     Weight 03/19/17 2013 201  lb (91.2 kg)     Height 03/19/17 2013 5\' 6"  (1.676 m)     Head Circumference --      Peak Flow --      Pain Score 03/19/17 2013 8     Pain Loc --      Pain Edu? --      Excl. in [place]? --     Constitutional: Alert and oriented. Well appearing and in no acute distress. Answers questions appropriately.  The patient sits up and lays down without any discomfort.  She is able to walk around without any evidence of pain. Eyes: Conjunctivae are normal.  EOMI. PERRLA.  No scleral icterus. Head: Atraumatic. Nose: No congestion/rhinnorhea. Mouth/Throat: Mucous membranes are moist.  Neck: No stridor.  Supple.  No JVD.  No meningismus.  Full range of motion without pain. Cardiovascular: Normal rate, regular rhythm. No murmurs, rubs or gallops.  Respiratory: Normal respiratory effort.  No accessory muscle use or retractions. Lungs CTAB.  No wheezes, rales or ronchi. Gastrointestinal: Overweight.  Soft, nontender and nondistended.  No guarding or rebound.  No peritoneal signs. Musculoskeletal: No LE edema. Neurologic:  A&Ox3.  Speech is clear.  Face and smile are symmetric.  EOMI. PERRLA.  Moves all extremities well.  Normal gait without ataxia. Skin:  Skin is warm, dry and intact. No rash noted. Psychiatric: Mood and affect are normal. Speech and behavior are normal.  Normal judgement.  ____________________________________________   LABS (all labs ordered are listed, but only abnormal results are displayed)  Labs Reviewed - No data to display ____________________________________________  EKG  Not indicated ____________________________________________  RADIOLOGY  No results found.  ____________________________________________   PROCEDURES  Procedure(s) performed: None  Procedures  Critical Care performed: No ____________________________________________   INITIAL IMPRESSION / ASSESSMENT AND PLAN / ED COURSE  Pertinent labs & imaging results that were available during my care of  the patient were reviewed by me and considered in my medical decision making (see chart for details).  37 y.o. female with a history of new onset chronic headaches over the past 4-5 months, presenting with a typical headache that is lasting longer than usual and not responsive to Topamax, Fioricet, Toradol, Decadron or Zofran.  Overall, the patient is hemodynamically stable.  She has no focal neurologic deficits on my examination and acute CVA or mass is unlikely.  However, she states that she has not had any intracranial imaging, so a CT head has been ordered.  I have also ordered a pregnancy test and basic labs.  Idiopathic intracranial hypertension is possible, but there is no indication for emergent LP today.  No evidence of meningismus.  Venous sinus thrombosis is possible, pt has smoking risk factor, but again much less likely.  We will plan to treat the patient symptomatically, and if her workup is reassuring and she is feeling better, have her follow-up with an outpatient neurologist that she has not yet seen a headache specialist despite the frequency of her symptoms.  Plan  reevaluation for final disposition.  ----------------------------------------- 10:04 PM on 03/19/2017 -----------------------------------------  The patient has a positive pregnancy test.  She states that she was pregnant, with a positive pregnancy test with her gynecologist on 2/13.  She underwent therapeutic abortion 2/16 and was supposed to follow-up with her gynecologist yesterday but was unable to do so due to her headache.  I have encouraged her to make sure that she reschedules that important.  She states she is having some minimal vaginal bleeding without any pain at this time.   ----------------------------------------- 10:51 PM on 03/19/2017 -----------------------------------------  Patient's pain has completely resolved and her CT scan is negative.  Her laboratory studies are reassuring.  At this time, the  patient is ready for discharge.  She will follow-up with the neurologist, as well as her gynecologist for routine abortion follow-up.  ____________________________________________  FINAL CLINICAL IMPRESSION(S) / ED DIAGNOSES  Final diagnoses:  None         NEW MEDICATIONS STARTED DURING THIS VISIT:  New Prescriptions   No medications on file      Eula Listen, MD 03/19/17 2252

## 2017-03-19 NOTE — ED Triage Notes (Signed)
Pt presents to ED with severe headache for the past 2 days. Seen by urgent care and given two shots and 2 medications to take at home. Pt instructed to come to ED if not better. Pt reports no improvement. Pain is located to the front of her head and behind her eyes with pressure at the base of her head into her neck. Pt reports vomiting X2 since onset of pain.

## 2017-03-19 NOTE — ED Notes (Signed)
Pt reports a recent abortion. Pt no longer pregnant.

## 2017-03-19 NOTE — ED Provider Notes (Signed)
[facility]    CSN: [id number] Arrival date & time: 03/19/17  1216     History   Chief Complaint Chief Complaint  Patient presents with  . Migraine    HPI Denise Patrick is a 37 y.o. female.   HPI  37 year old female presents with migraines that actually started in November.  She has had Topamax which she states has not worked and also Reglan she took last night but does not provide relief.  This current episode started last night.  It was associated with an aura.  She has photophobia.  The pain that she has is over the frontal into the occipital area.  She also has neck tenderness posteriorly.  Not had any neurological symptoms other than described.      Past Medical History:  Diagnosis Date  . Anxiety   . Cancer (HCC)    Cervical  . Depression   . Headache   . History of kidney stones   . Renal disorder     Patient Active Problem List   Diagnosis Date Noted  . UTI (urinary tract infection) 03/08/2017  . Positive urine pregnancy test 03/04/2017  . 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 Severe dysplasia of cervix (CIN III) 09/27/2015  . Migraine 09/12/2015  . Endometriosis 09/06/2015  . Dysmenorrhea 09/06/2015  . ASCUS with positive high risk HPV 09/06/2015  . Tobacco user 09/06/2015  . Upper back pain 05/01/2015  . Vision changes 05/01/2015  . Tooth pain 05/01/2015    Past Surgical History:  Procedure Laterality Date  . KIDNEY STONE SURGERY    . LEEP N/A 11/05/2015   Procedure: LOOP ELECTROSURGICAL EXCISION PROCEDURE (LEEP);  Surgeon: Brayton Mars, MD;  Location: [facility] ORS;  Service: Gynecology;  Laterality: N/A;  . TONSILLECTOMY      OB History    Gravida Para Term Preterm AB Living   2 2 2     2    SAB TAB Ectopic Multiple Live Births           2       Home Medications    Prior to Admission medications   Medication Sig Start Date End Date Taking? Authorizing Provider  LORazepam (ATIVAN) 1 MG tablet Take 1 mg by mouth every 8  (eight) hours.   Yes [provider]  metoCLOPramide (REGLAN) 10 MG tablet Take 1 tablet (10 mg total) by mouth 4 (four) times daily. 03/04/17  Yes Defrancesco, Alanda Slim, MD  Vilazodone HCl (VIIBRYD) 10 MG TABS Take by mouth daily.   Yes [provider]  butalbital-acetaminophen-caffeine (FIORICET, ESGIC) [phone] MG tablet Take 1-2 tablets by mouth every 6 (six) hours as needed for headache. 03/19/17 03/19/18  Lorin Picket, PA-C    Family History Family History  Problem Relation Age of Onset  . Hypertension Father   . Cancer Father   . Cancer Maternal Grandmother   . COPD Maternal Grandmother     Social History Social History   Tobacco Use  . Smoking status: Current Every Day Smoker    Packs/day: 0.50    Years: 15.00    Pack years: 7.50    Types: Cigarettes  . Smokeless tobacco: Never Used  Substance Use Topics  . Alcohol use: No  . Drug use: No     Allergies   Patient has no known allergies.   Review of Systems Review of Systems  Constitutional: Positive for activity change. Negative for appetite change, chills, fatigue and fever.  Neurological: Positive for headaches.  All other systems reviewed and are negative.    Physical Exam Triage Vital Signs ED Triage Vitals  Enc Vitals Group     BP 03/19/17 1302 116/62     Pulse Rate 03/19/17 1302 71     Resp 03/19/17 1302 18     Temp 03/19/17 1302 98.4 F (36.9 C)     Temp Source 03/19/17 1302 Oral     SpO2 03/19/17 1302 99 %     Weight 03/19/17 1300 201 lb (91.2 kg)     Height 03/19/17 1300 5\' 6"  (1.676 m)     Head Circumference --      Peak Flow --      Pain Score 03/19/17 1300 8     Pain Loc --      Pain Edu? --      Excl. in [place]? --    No data found.  Updated Vital Signs BP 116/62 (BP Location: Left Arm)   Pulse 71   Temp 98.4 F (36.9 C) (Oral)   Resp 18   Ht 5\' 6"  (1.676 m)   Wt 201 lb (91.2 kg)   LMP 03/19/2017   SpO2 99%   BMI 32.44 kg/m   Visual Acuity Right Eye  Distance:   Left Eye Distance:   Bilateral Distance:    Right Eye Near:   Left Eye Near:    Bilateral Near:     Physical Exam  Constitutional: She is oriented to person, place, and time. She appears well-developed and well-nourished. No distress.  HENT:  Head: Normocephalic.  Right Ear: External ear normal.  Left Ear: External ear normal.  Nose: Nose normal.  Mouth/Throat: Oropharynx is clear and moist. No oropharyngeal exudate.  Eyes: EOM are normal. Pupils are equal, round, and reactive to light. Right eye exhibits no discharge. Left eye exhibits no discharge.  Neck: Normal range of motion.  Musculoskeletal: Normal range of motion.  Neurological: She is alert and oriented to person, place, and time. She displays normal reflexes. No cranial nerve deficit or sensory deficit. She exhibits normal muscle tone. Coordination normal.  Skin: Skin is warm and dry. She is not diaphoretic.  Psychiatric: She has a normal mood and affect. Her behavior is normal. Judgment and thought content normal.  Nursing note and vitals reviewed.    UC Treatments / Results  Labs (all labs ordered are listed, but only abnormal results are displayed) Labs Reviewed - No data to display  EKG  EKG Interpretation None       Radiology No results found.  Procedures Procedures (including critical care time)  Medications Ordered in UC Medications  ketorolac (TORADOL) injection 60 mg (60 mg Intramuscular Given 03/19/17 1352)  ondansetron (ZOFRAN-ODT) disintegrating tablet 8 mg (8 mg Oral Given 03/19/17 1350)  dexamethasone (DECADRON) injection 10 mg (10 mg Intramuscular Given 03/19/17 1351)   Patient's headache was graded as an 8 prior to the Toradol but decreased to a 6 afterwards and then returned to an 8 prior to discharge.  Initial Impression / Assessment and Plan / UC Course  I have reviewed the triage vital signs and the nursing notes.  Pertinent labs & imaging results that were available during  my care of the patient were reviewed by me and considered in my medical decision making (see chart for details).     Plan: 1. Test/x-ray results and diagnosis reviewed with patient 2. rx as per orders; risks, benefits, potential side effects reviewed with patient 3. Recommend supportive treatment with rest and  use of Fioricet today.  I have offered to send the patient to the emergency room but she would rather go home and take the Fioricet to see if that would help her.  If that fails then she will go to the emergency room later today.  The pain does worsen, she should go to the emergency room sooner even calling 911 if necessary. 4. F/u prn if symptoms worsen or don't improve   Final Clinical Impressions(s) / UC Diagnoses   Final diagnoses:  Tension-type headache, not intractable, unspecified chronicity pattern    ED Discharge Orders        Ordered    butalbital-acetaminophen-caffeine (FIORICET, ESGIC) [phone] MG tablet  Every 6 hours PRN     03/19/17 1425       Controlled Substance Prescriptions [place] Controlled Substance Registry consulted? Not Applicable   Lorin Picket, PA-C 03/19/17 1429

## 2017-03-19 NOTE — Discharge Instructions (Signed)
Please return to the emergency department for severe pain, vomiting, fever, numbness tingling or weakness, or for any other symptoms concerning to you.

## 2017-03-20 LAB — HCG, QUANTITATIVE, PREGNANCY: hCG, Beta Chain, Quant, S: 11069 m[IU]/mL — ABNORMAL HIGH (ref <5)

## 2017-03-24 ENCOUNTER — Encounter: Payer: Self-pay | Admitting: Emergency Medicine

## 2017-03-24 ENCOUNTER — Emergency Department
Admission: EM | Admit: 2017-03-24 | Discharge: 2017-03-24 | Disposition: A | Discharge status: Home / Self Care | Payer: Medicaid Other | Attending: Emergency Medicine | Admitting: Emergency Medicine

## 2017-03-24 DIAGNOSIS — Z87891 Personal history of nicotine dependence: Secondary | ICD-10-CM | POA: Diagnosis not present

## 2017-03-24 DIAGNOSIS — Z79899 Other long term (current) drug therapy: Secondary | ICD-10-CM | POA: Diagnosis not present

## 2017-03-24 DIAGNOSIS — Z8541 Personal history of malignant neoplasm of cervix uteri: Secondary | ICD-10-CM | POA: Diagnosis not present

## 2017-03-24 DIAGNOSIS — R519 Headache, unspecified: Secondary | ICD-10-CM

## 2017-03-24 DIAGNOSIS — R51 Headache: Secondary | ICD-10-CM | POA: Insufficient documentation

## 2017-03-24 MED ORDER — DIPHENHYDRAMINE HCL 50 MG/ML IJ SOLN
12.5000 mg | Freq: Once | INTRAMUSCULAR | Status: AC
  Administered 2017-03-24: 12.5 mg via INTRAVENOUS
  Filled 2017-03-24: qty 1

## 2017-03-24 MED ORDER — DEXAMETHASONE SODIUM PHOSPHATE 10 MG/ML IJ SOLN
INTRAMUSCULAR | Status: AC
  Filled 2017-03-24: qty 1

## 2017-03-24 MED ORDER — FENTANYL CITRATE (PF) 100 MCG/2ML IJ SOLN
INTRAMUSCULAR | Status: AC
  Filled 2017-03-24: qty 2

## 2017-03-24 MED ORDER — SODIUM CHLORIDE 0.9 % IV BOLUS (SEPSIS)
1000.0000 mL | Freq: Once | INTRAVENOUS | Status: AC
  Administered 2017-03-24: 1000 mL via INTRAVENOUS

## 2017-03-24 MED ORDER — PROCHLORPERAZINE EDISYLATE 5 MG/ML IJ SOLN
10.0000 mg | Freq: Once | INTRAMUSCULAR | Status: AC
  Administered 2017-03-24: 10 mg via INTRAVENOUS
  Filled 2017-03-24: qty 2

## 2017-03-24 MED ORDER — DEXAMETHASONE SODIUM PHOSPHATE 10 MG/ML IJ SOLN
10.0000 mg | Freq: Once | INTRAMUSCULAR | Status: AC
Start: 2017-03-24 — End: 2017-03-24
  Administered 2017-03-24: 10 mg via INTRAVENOUS

## 2017-03-24 MED ORDER — FENTANYL CITRATE (PF) 100 MCG/2ML IJ SOLN
50.0000 ug | Freq: Once | INTRAMUSCULAR | Status: AC
  Administered 2017-03-24: 50 ug via INTRAVENOUS

## 2017-03-24 MED ORDER — KETOROLAC TROMETHAMINE 30 MG/ML IJ SOLN
30.0000 mg | Freq: Once | INTRAMUSCULAR | Status: AC
Start: 2017-03-24 — End: 2017-03-24
  Administered 2017-03-24: 30 mg via INTRAVENOUS
  Filled 2017-03-24: qty 1

## 2017-03-24 NOTE — ED Notes (Signed)
Pt called again in lobby at this time with no answer.

## 2017-03-24 NOTE — ED Notes (Signed)
Pt reports she was seen last week for same symptoms that have since returned. Pt reports relief with medications. Pt reports she has a follow up with neuro tomorrow but was unable to wait.

## 2017-03-24 NOTE — ED Provider Notes (Addendum)
[facility] Emergency Department Provider Note  ____________________________________________   I have reviewed the triage vital signs and the nursing notes. Where available I have reviewed prior notes and, if possible and indicated, outside hospital notes.    HISTORY  Chief Complaint Headache    HPI Denise Patrick is a 37 y.o. female with a history of anxiety, history of depression, history of migraines for years, states that she fell in November and bumped her head, she forgot to report this to prior providers.  Shortly after bumping her head at work, she began to have headaches which have persisted now for 3 months.  She has had headaches all of December all of January all of February now into March.  Headaches frequency waxes and wanes as does their intensity.  They are always the same.  Centralized pressure-like headaches with photophobia and nausea.  She is been seen in the emergency room for this a few times.  Had a negative CT scan of the head recently.  Does have a neurologic follow-up of the headache did come back gradually yesterday and she would like to be seen again.  She denies any fever chills stiff neck focal numbness or weakness change in vision change in hearing or other complaints.  Headache is persistent, she does try home medications and they do not seem to help.  She denies any other concerns.  She has not hit her head or suffered any trauma since November.  There is a family history of migraines as well. Patient also states that headaches seem to be somewhat worse when she is under stress she did have a recent TAB, she is no longer vaginal bleeding from that has no abdominal pain    Past Medical History:  Diagnosis Date  . Anxiety   . Cancer (HCC)    Cervical  . Depression   . Headache   . History of kidney stones   . Renal disorder     Patient Active Problem List   Diagnosis Date Noted  . UTI (urinary tract infection) 03/08/2017  .  Positive urine pregnancy test 03/04/2017  . 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 Severe dysplasia of cervix (CIN III) 09/27/2015  . Migraine 09/12/2015  . Endometriosis 09/06/2015  . Dysmenorrhea 09/06/2015  . ASCUS with positive high risk HPV 09/06/2015  . Tobacco user 09/06/2015  . Upper back pain 05/01/2015  . Vision changes 05/01/2015  . Tooth pain 05/01/2015    Past Surgical History:  Procedure Laterality Date  . KIDNEY STONE SURGERY    . LEEP N/A 11/05/2015   Procedure: LOOP ELECTROSURGICAL EXCISION PROCEDURE (LEEP);  Surgeon: Brayton Mars, MD;  Location: [facility] ORS;  Service: Gynecology;  Laterality: N/A;  . TONSILLECTOMY      Prior to Admission medications   Medication Sig Start Date End Date Taking? Authorizing Provider  butalbital-acetaminophen-caffeine (FIORICET, ESGIC) [phone] MG tablet Take 1-2 tablets by mouth every 6 (six) hours as needed for headache. 03/19/17 03/19/18  Lorin Picket, PA-C  ketorolac (TORADOL) 10 MG tablet Take 1 tablet (10 mg total) by mouth every 8 (eight) hours as needed for moderate pain (with food). 03/19/17   Eula Listen, MD  LORazepam (ATIVAN) 1 MG tablet Take 1 mg by mouth every 8 (eight) hours.    [provider]  metoCLOPramide (REGLAN) 10 MG tablet Take 1 tablet (10 mg total) by mouth 4 (four) times daily. 03/04/17   Defrancesco, Alanda Slim, MD  prochlorperazine (COMPAZINE) 5 MG tablet Take 1  tablet (5 mg total) by mouth every 6 (six) hours as needed for nausea or vomiting. 03/19/17   Eula Listen, MD  Vilazodone HCl (VIIBRYD) 10 MG TABS Take by mouth daily.    [provider]    Allergies Patient has no known allergies.  Family History  Problem Relation Age of Onset  . Hypertension Father   . Cancer Father   . Cancer Maternal Grandmother   . COPD Maternal Grandmother     Social History Social History   Tobacco Use  . Smoking status: Former Smoker    Packs/day: 0.50    Years: 15.00     Pack years: 7.50    Types: Cigarettes  . Smokeless tobacco: Never Used  Substance Use Topics  . Alcohol use: No  . Drug use: No    Review of Systems Constitutional: No fever/chills Eyes: No visual changes. ENT: No sore throat. No stiff neck no neck pain Cardiovascular: Denies chest pain. Respiratory: Denies shortness of breath. Gastrointestinal:   no vomiting.  No diarrhea.  No constipation. Genitourinary: Negative for dysuria. Musculoskeletal: Negative lower extremity swelling Skin: Negative for rash. Neurological: Negative for atypical headaches, focal weakness or numbness.   ____________________________________________   PHYSICAL EXAM:  VITAL SIGNS: ED Triage Vitals [03/24/17 1728]  Enc Vitals Group     BP (!) 150/71     Pulse      Resp 18     Temp 98.8 F (37.1 C)     Temp Source Oral     SpO2 100 %     Weight 200 lb (90.7 kg)     Height 5\' 6"  (1.676 m)     Head Circumference      Peak Flow      Pain Score 8     Pain Loc      Pain Edu?      Excl. in [place]?     Constitutional: Alert and oriented. Well appearing and in no acute distress.  Patient has the light off but does not appear, when the light is on, to be any significant distress.  She is very animated and talks to me with no difficulty, Eyes: Conjunctivae are normal Head: Atraumatic HEENT: No congestion/rhinnorhea. Mucous membranes are moist.  Oropharynx non-erythematous Neck:   Nontender with no meningismus, no masses, no stridor Cardiovascular: Normal rate, regular rhythm. Grossly normal heart sounds.  Good peripheral circulation. Respiratory: Normal respiratory effort.  No retractions. Lungs CTAB. Abdominal: Soft and nontender. No distention. No guarding no rebound Back:  There is no focal tenderness or step off.  there is no midline tenderness there are no lesions noted. there is no CVA tenderness Musculoskeletal: No lower extremity tenderness, no upper extremity tenderness. No joint effusions, no DVT  signs strong distal pulses no edema Neurologic:   Cranial nerves II through XII are grossly intact 5 out of 5 strength bilateral upper and lower extremity. Finger to nose within normal limits heel to shin within normal limits, speech is normal with no word finding difficulty or dysarthria, reflexes symmetric, pupils are equally round and reactive to light, there is no pronator drift, sensation is normal, vision is intact to confrontation, gait is deferred, there is no nystagmus, normal neurologic exam funduscopic exam to the extent that I can determine without dilating the patient shows no evidence of papilledema, nor is there any tenderness to palpation along the temporal arteries bilaterally Skin:  Skin is warm, dry and intact. No rash noted. Psychiatric: Mood and affect are normal.  Speech and behavior are normal.  ____________________________________________   LABS (all labs ordered are listed, but only abnormal results are displayed)  Labs Reviewed - No data to display  Pertinent labs  results that were available during my care of the patient were reviewed by me and considered in my medical decision making (see chart for details). ____________________________________________  EKG  I personally interpreted any EKGs ordered by me or triage  ____________________________________________  RADIOLOGY  Pertinent labs & imaging results that were available during my care of the patient were reviewed by me and considered in my medical decision making (see chart for details). If possible, patient and/or family made aware of any abnormal findings.  No results found. ____________________________________________    PROCEDURES  Procedure(s) performed: None  Procedures  Critical Care performed: None  ____________________________________________   INITIAL IMPRESSION / ASSESSMENT AND PLAN / ED COURSE  Pertinent labs & imaging results that were available during my care of the patient were  reviewed by me and considered in my medical decision making (see chart for details).  Here with recurrent headaches, she has had migraines for years, however, the headaches have worsened since she bumped her head 3 or 4 months ago.  Neurologically intact negative CT scan recently, which I have reviewed.  ----------------------------------------- 7:50 PM on 03/24/2017 -----------------------------------------   Pt remains at neurologic baseline. At this time, there is nothing to suggest or support the diagnosis of subarachnoid hemorrhage, aneurysmal event, meningitis, tumor or mass, cavernous thrombosis, encephalitis, ischemic stroke, pseudotumor cerebri, glaucoma, temporal arteritis, or any other acute intracrania/neurological process.  He is somewhat upset that we are not getting to the bottom of her headaches but I do not think an acute MRI is indicated in the emergency room with the patient with neurologic exam which is completely normal, and headaches now for 3 months.  I feel that she would be better served by seeing a neurologist first.  She remains neurologically intact.  She and I discussed LP but at this time I do not think there is a clear indication for it I do not see any papilledema, this is a post head injury headache that is lasted now for months with negative imaging.  I certainly do not think she has a head bleed, and pseudotumor again is not thought to be very likely given my exam but I think a neurologic evaluation would be of use prior to lumbar puncture.  Patient would prefer not to have it done and I do not think that is unreasonable I am very encouraged that she will see a neurologist tomorrow.   ----------------------------------------- 8:18 PM on 03/24/2017 -----------------------------------------  Patient ambulatory throughout the department with no evidence of significant discomfort fortunately.  She states she still has a headache and it is barely but just however.  She  remains completely neurologically intact, I did discuss LP with her which she adamantly refuses I discussed trying paraspinal C7 level cephalic injections which she refuses.  She wants something else in the IV.  I will give her 1 more try of different medications and if that does not work, think then we will likely have to defer further treatment for neurology.  Despite her headache for 3 months she is very well-appearing  ----------------------------------------- 8:52 PM on 03/24/2017 -----------------------------------------  Feels much better after the last round of medications, she is requesting discharge she is going to call an uber she knows she must not drive.      ____________________________________________   FINAL CLINICAL IMPRESSION(S) /  ED DIAGNOSES  Final diagnoses:  At high risk for elopement      This chart was dictated using voice recognition software.  Despite best efforts to proofread,  errors can occur which can change meaning.       Schuyler Amor, MD 03/24/17 Earney Navy    Schuyler Amor, MD 03/24/17 2019    Schuyler Amor, MD 03/24/17 2053

## 2017-03-24 NOTE — ED Notes (Signed)
Pt called in the [place] with no response. Walked out to the front entrance. Pt is not visualized.Marland Kitchen

## 2017-03-24 NOTE — ED Notes (Signed)
Pt. Verbalizes understanding of d/c instructions, medications, and follow-up.  Pt. In NAD at time of d/c and denies further concerns regarding this visit. Pt. Stable at the time of departure from the unit, departing unit by the safest and most appropriate manner per that pt condition and limitations with all belongings accounted for. Pt advised to return to the ED at any time for emergent concerns, or for new/worsening symptoms.

## 2017-03-24 NOTE — ED Triage Notes (Signed)
Pt c/o headache for two days was seen here for same given some meds for same and discharged, pt states meds are not helping.

## 2017-03-24 NOTE — Discharge Instructions (Signed)
Follow close with primary care doctor and neurologist tomorrow, if you have increased pain, fever, vomiting, stiff neck, or you feel worse in any way please return to the emergency department.

## 2017-03-24 NOTE — ED Notes (Signed)
Pt helped to the bathroom to void 

## 2017-03-24 NOTE — ED Notes (Signed)
Pt placed on nonrebreather per MD for migraine symptoms

## 2017-03-24 NOTE — ED Notes (Signed)
Called for room at this time in lobby with no answer

## 2017-03-24 NOTE — ED Notes (Signed)
Pt states that her head continues to hurt and she wants to know "if there will be further meds" and if not she wants to call her ride

## 2017-03-24 NOTE — ED Notes (Signed)
Pt refusal to reassess vitals in order to catch ride Denise Patrick)

## 2017-03-24 NOTE — ED Notes (Signed)
Pt roomed to wrong treatment and error corrected.

## 2017-03-24 NOTE — ED Notes (Signed)
Gave pt warm blankets for comfort and dimmed the lights

## 2017-03-25 ENCOUNTER — Other Ambulatory Visit: Payer: Self-pay | Admitting: Obstetrics and Gynecology

## 2017-03-25 DIAGNOSIS — O200 Threatened abortion: Secondary | ICD-10-CM

## 2017-03-26 ENCOUNTER — Ambulatory Visit (INDEPENDENT_AMBULATORY_CARE_PROVIDER_SITE_OTHER): Payer: Medicaid Other

## 2017-03-26 ENCOUNTER — Encounter: Payer: Self-pay | Admitting: Obstetrics and Gynecology

## 2017-03-26 ENCOUNTER — Ambulatory Visit (INDEPENDENT_AMBULATORY_CARE_PROVIDER_SITE_OTHER): Payer: Medicaid Other | Admitting: Obstetrics and Gynecology

## 2017-03-26 VITALS — BP 103/66 | HR 91 | Wt 203.0 lb

## 2017-03-26 DIAGNOSIS — D069 Carcinoma in situ of cervix, unspecified: Secondary | ICD-10-CM

## 2017-03-26 DIAGNOSIS — O200 Threatened abortion: Secondary | ICD-10-CM | POA: Diagnosis not present

## 2017-03-26 DIAGNOSIS — Z332 Encounter for elective termination of pregnancy: Secondary | ICD-10-CM

## 2017-03-26 DIAGNOSIS — Z8541 Personal history of malignant neoplasm of cervix uteri: Secondary | ICD-10-CM | POA: Diagnosis not present

## 2017-03-26 DIAGNOSIS — R768 Other specified abnormal immunological findings in serum: Secondary | ICD-10-CM | POA: Diagnosis not present

## 2017-03-26 DIAGNOSIS — Z3009 Encounter for other general counseling and advice on contraception: Secondary | ICD-10-CM | POA: Diagnosis not present

## 2017-03-26 MED ORDER — FAMCICLOVIR 500 MG PO TABS: 500.0000 mg | ORAL_TABLET | Freq: Three times a day (TID) | ORAL | 2 refills | Status: AC

## 2017-03-26 NOTE — Progress Notes (Signed)
Pt needs herps meds. Had headaches for before taking abortion pill but has gotten worst since after taking the pill.

## 2017-03-26 NOTE — Progress Notes (Signed)
HPI:      [name] is a 37 y.o. [phone] who LMP was Patient's last menstrual period was 03/09/2017.  Subjective:   She presents today for multiple problems she describes "this office has not done anything right regarding my medical care."  I have detailed her complaints below.  It seems that her biggest concern is that she is having difficulty reviewing all of her results on my chart and her expectation is that she be able to review everything that has ever been done medically for her in my chart.  She says that it is very important for her to see her results "on paper"and not just get a verbal report. 1.  She says that she was told that she had a pregnancy with twins and that she could not "handle twins".  Thus she decided to have an abortion.  She says that prior to her taking medication for an abortion they told her that she only had a singleton pregnancy at the abortion clinic.  She says that she would not have had the abortion if she did not have twins.  I asked her why she proceeded and took the medicine anyway and she did not seem to have an answer. 2.  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  She states that she has had abnormal Pap smears and underwent LEEP and since that time has been told that her Pap smears are negative and that she is no longer positive for HPV.  She says that she does not believe this unless she sees the report in writing and she has been unable to view this in my chart.   4.  She is concerned because she still has a positive pregnancy test and her ultrasound today reveals an empty uterus consistent with a completed AB. 5.  She says that "no one has ever talked to her about any useful birth control"and she would like to discuss birth control methods. (Patient states that she has quit smoking cigarettes "with the exception of last week")    Hx: The following portions of the patient's history were reviewed  and updated as appropriate:             She  has a past medical history of Anxiety, Cancer ([place]), Depression, Headache, History of kidney stones, and Renal disorder. She does not have any pertinent problems on file. She  has a past surgical history that includes Kidney stone surgery; Tonsillectomy; and LEEP (N/A, 11/05/2015). Her family history includes COPD in her maternal grandmother; Cancer in her father and maternal grandmother; Hypertension in her father. She  reports that she has quit smoking. Her smoking use included cigarettes. She has a 7.50 pack-year smoking history. she has never used smokeless tobacco. She reports that she does not drink alcohol or use drugs. She has a current medication list which includes the following prescription(s): butalbital-acetaminophen-caffeine, famciclovir, ketorolac, lorazepam, metoclopramide, prochlorperazine, and vilazodone hcl. She has No Known Allergies.       Review of Systems:  Review of Systems  Constitutional: Denied constitutional symptoms, night sweats, recent illness, fatigue, fever, insomnia and weight loss.  Eyes: Denied eye symptoms, eye pain, photophobia, vision change and visual disturbance.  Ears/Nose/Throat/Neck: Denied ear, nose, throat or neck symptoms, hearing loss, nasal discharge, sinus congestion and sore throat.  Cardiovascular: Denied cardiovascular symptoms, arrhythmia, chest pain/pressure, edema, exercise intolerance, orthopnea and palpitations.  Respiratory: Denied pulmonary symptoms, asthma,  pleuritic pain, productive sputum, cough, dyspnea and wheezing.  Gastrointestinal: Denied, gastro-esophageal reflux, melena, nausea and vomiting.  Genitourinary: Denied genitourinary symptoms including symptomatic vaginal discharge, pelvic relaxation issues, and urinary complaints.  Musculoskeletal: Denied musculoskeletal symptoms, stiffness, swelling, muscle weakness and myalgia.  Dermatologic: Denied dermatology symptoms, rash and scar.   Neurologic: Denied neurology symptoms, dizziness, headache, neck pain and syncope.  Psychiatric: Denied psychiatric symptoms, anxiety and depression.  Endocrine: Denied endocrine symptoms including hot flashes and night sweats.   Meds:   Current Outpatient Medications on File Prior to Visit  Medication Sig Dispense Refill  . butalbital-acetaminophen-caffeine (FIORICET, ESGIC) 50-325-40 MG tablet Take 1-2 tablets by mouth every 6 (six) hours as needed for headache. 20 tablet 0  . ketorolac (TORADOL) 10 MG tablet Take 1 tablet (10 mg total) by mouth every 8 (eight) hours as needed for moderate pain (with food). 15 tablet 0  . LORazepam (ATIVAN) 1 MG tablet Take 1 mg by mouth every 8 (eight) hours.    . metoCLOPramide (REGLAN) 10 MG tablet Take 1 tablet (10 mg total) by mouth 4 (four) times daily. 40 tablet 1  . prochlorperazine (COMPAZINE) 5 MG tablet Take 1 tablet (5 mg total) by mouth every 6 (six) hours as needed for nausea or vomiting. 20 tablet 0  . Vilazodone HCl (VIIBRYD) 10 MG TABS Take by mouth daily.     No current facility-administered medications on file prior to visit.     Objective:     Vitals:   03/26/17 1451  BP: 103/66  Pulse: 91              I have reviewed significant portions of her chart with her.  Including a review of the ultrasound findings, review and pronounce of Pap smears and HPV testing, review of HSV testing.  Assessment:    [id number] Patient Active Problem List   Diagnosis Date Noted  . UTI (urinary tract infection) 03/08/2017  . Positive urine pregnancy test 03/04/2017  . 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 Severe dysplasia of cervix (CIN III) 09/27/2015  . Migraine 09/12/2015  . Endometriosis 09/06/2015  . Dysmenorrhea 09/06/2015  . ASCUS with positive high risk HPV 09/06/2015  . Tobacco user 09/06/2015  . Upper back pain 05/01/2015  . Vision changes 05/01/2015  . Tooth pain 05/01/2015     1. Birth control counseling   2. Abortion in first  trimester   3. HSV-2 seropositive   4. Severe dysplasia of cervix (CIN III)     1.  Upon review of her ultrasound it revealed the possibility of 2 small yolk sacs but only one embryo.  It specifically states that the patient "could not tolerate"the remainder of the exam to confirm singleton versus twins.  It was also suggested she return in 1 week for a follow-up for confirmation. (She told me that one week was too long and that she could not abort the baby 1 week later she had to do it immediately)  2.  She does indeed have HSV antibodies present.  We discussed episodic versus suppressive treatment and she has chosen episodic treatment.  3.  I gave her written copies of her most recent Pap smear which was negative with negative types 16 and 18.  She seemed satisfied to have these written copies.  4.  We have discussed positive pregnancy test and miscarriages and abortions and HOW they can often last many weeks past the actual event.  5.  We have discussed multiple birth control methods.  See below.   Plan:            1.  Prescription for episodic medication for HSV prescribed   2.  Birth Control I discussed multiple birth control options and methods with the patient.  The risks and benefits of each were reviewed.  IUD Literature on [place] given.  Risks and benefits of each discussed.  She is considering IUD as an option for birth/cycle control. Patient has chosen IUD for birth control and will call [place] with her next menstrual.  Orders No orders of the defined types were placed in this encounter.    Meds ordered this encounter  Medications  . famciclovir (FAMVIR) 500 MG tablet    Sig: Take 1 tablet (500 mg total) by mouth 3 (three) times daily for 3 days.    Dispense:  9 tablet    Refill:  2      F/U  Return for She is to call at the start of next menses. I spent 48 minutes with this patient of which greater than 50% was spent discussing see above note with  multiple issues and discussions.  Finis Bud, M.D. 03/26/2017 4:41 PM

## 2017-04-08 ENCOUNTER — Telehealth: Payer: Self-pay | Admitting: Obstetrics and Gynecology

## 2017-04-08 NOTE — Telephone Encounter (Signed)
Pt states Dr. Amalia Hailey instructed her to call the office when she starts her period for IUD insertion. Pt has started her period and wishes to speak with someone before scheduling appt.

## 2017-04-08 NOTE — Telephone Encounter (Signed)
Pt started her cycle on 04/04/2017. Today she is bleeding heavy.  Appt made for Friday at 8:45. Pt states she will be here by 9.

## 2017-04-09 ENCOUNTER — Other Ambulatory Visit: Payer: Self-pay

## 2017-04-09 ENCOUNTER — Telehealth: Payer: Self-pay | Admitting: Obstetrics and Gynecology

## 2017-04-09 ENCOUNTER — Emergency Department
Admission: EM | Admit: 2017-04-09 | Discharge: 2017-04-09 | Disposition: A | Discharge status: Home / Self Care | Payer: Medicaid Other | Attending: Emergency Medicine | Admitting: Emergency Medicine

## 2017-04-09 ENCOUNTER — Encounter: Payer: Self-pay | Admitting: Emergency Medicine

## 2017-04-09 DIAGNOSIS — G8929 Other chronic pain: Secondary | ICD-10-CM

## 2017-04-09 DIAGNOSIS — Z87891 Personal history of nicotine dependence: Secondary | ICD-10-CM | POA: Diagnosis not present

## 2017-04-09 DIAGNOSIS — R51 Headache: Secondary | ICD-10-CM | POA: Diagnosis present

## 2017-04-09 DIAGNOSIS — Z8541 Personal history of malignant neoplasm of cervix uteri: Secondary | ICD-10-CM | POA: Insufficient documentation

## 2017-04-09 MED ORDER — HYDROMORPHONE HCL 1 MG/ML IJ SOLN
INTRAMUSCULAR | Status: AC
  Administered 2017-04-09: 1 mg via INTRAMUSCULAR
  Filled 2017-04-09: qty 1

## 2017-04-09 MED ORDER — ONDANSETRON 4 MG PO TBDP
ORAL_TABLET | ORAL | Status: AC
  Administered 2017-04-09: 4 mg via ORAL
  Filled 2017-04-09: qty 1

## 2017-04-09 MED ORDER — IBUPROFEN 800 MG PO TABS: 800.0000 mg | ORAL_TABLET | Freq: Three times a day (TID) | ORAL | 0 refills | Status: DC | PRN

## 2017-04-09 MED ORDER — ONDANSETRON 4 MG PO TBDP: 4.0000 mg | ORAL_TABLET | Freq: Three times a day (TID) | ORAL | 0 refills | Status: AC | PRN

## 2017-04-09 MED ORDER — ONDANSETRON 4 MG PO TBDP
4.0000 mg | ORAL_TABLET | Freq: Once | ORAL | Status: AC
  Administered 2017-04-09: 4 mg via ORAL

## 2017-04-09 MED ORDER — DIAZEPAM 5 MG PO TABS
10.0000 mg | ORAL_TABLET | Freq: Once | ORAL | Status: AC
Start: 2017-04-09 — End: 2017-04-09
  Administered 2017-04-09: 10 mg via ORAL

## 2017-04-09 MED ORDER — DIAZEPAM 5 MG PO TABS
ORAL_TABLET | ORAL | Status: AC
  Administered 2017-04-09: 10 mg via ORAL
  Filled 2017-04-09: qty 2

## 2017-04-09 MED ORDER — AZITHROMYCIN 250 MG PO TABS: ORAL_TABLET | ORAL | 0 refills | Status: DC

## 2017-04-09 MED ORDER — OXYMETAZOLINE HCL 0.05 % NA SOLN: 1.0000 | Freq: Once | NASAL | Status: DC

## 2017-04-09 MED ORDER — HYDROMORPHONE HCL 1 MG/ML IJ SOLN
1.0000 mg | Freq: Once | INTRAMUSCULAR | Status: AC
  Administered 2017-04-09: 1 mg via INTRAMUSCULAR

## 2017-04-09 NOTE — Telephone Encounter (Signed)
Pt  states her cycle is heavy- changing q 2 hours. Pos for cramps. NO meds tried. Erx ibup 800. Advised pt to take every 8 hours. Be sure to take prior to iud insertion. Pt advised if soaking a pad q 30 minutes to contact office. Pt voices understanding.

## 2017-04-09 NOTE — ED Provider Notes (Signed)
[facility] Emergency Department Provider Note       Time seen: ----------------------------------------- 10:47 PM on 04/09/2017 -----------------------------------------   I have reviewed the triage vital signs and the nursing notes.  HISTORY   Chief Complaint Migraine    HPI Denise Patrick is a 37 y.o. female with a history of anxiety, depression, headaches, kidney stones who presents to the ED for gradual headache.  Patient has been seen multiple times for similar.  Patient reports having had an abortion performed about a month ago and is still having vaginal bleeding.  Patient states the headache is severe and persistent despite numerous medications.  She is currently under the care of neurology for same.  Past Medical History:  Diagnosis Date  . Anxiety   . Cancer (HCC)    Cervical  . Depression   . Headache   . History of kidney stones   . Renal disorder     Patient Active Problem List   Diagnosis Date Noted  . UTI (urinary tract infection) 03/08/2017  . Positive urine pregnancy test 03/04/2017  . 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 Severe dysplasia of cervix (CIN III) 09/27/2015  . Migraine 09/12/2015  . Endometriosis 09/06/2015  . Dysmenorrhea 09/06/2015  . ASCUS with positive high risk HPV 09/06/2015  . Tobacco user 09/06/2015  . Upper back pain 05/01/2015  . Vision changes 05/01/2015  . Tooth pain 05/01/2015    Past Surgical History:  Procedure Laterality Date  . KIDNEY STONE SURGERY    . LEEP N/A 11/05/2015   Procedure: LOOP ELECTROSURGICAL EXCISION PROCEDURE (LEEP);  Surgeon: Brayton Mars, MD;  Location: [facility] ORS;  Service: Gynecology;  Laterality: N/A;  . TONSILLECTOMY      Allergies Patient has no known allergies.  Social History Social History   Tobacco Use  . Smoking status: Former Smoker    Packs/day: 0.50    Years: 15.00    Pack years: 7.50    Types: Cigarettes  . Smokeless tobacco: Never Used   Substance Use Topics  . Alcohol use: No  . Drug use: No   Review of Systems Constitutional: Negative for fever. Cardiovascular: Negative for chest pain. Respiratory: Negative for shortness of breath. Gastrointestinal: Negative for abdominal pain, vomiting and diarrhea. Genitourinary: Positive for vaginal bleeding Musculoskeletal: Negative for back pain. Skin: Negative for rash. Neurological: Positive for headache  All systems negative/normal/unremarkable except as stated in the HPI  ____________________________________________   PHYSICAL EXAM:  VITAL SIGNS: ED Triage Vitals  Enc Vitals Group     BP 04/09/17 2030 109/66     Pulse Rate 04/09/17 2030 85     Resp 04/09/17 2030 20     Temp 04/09/17 2030 98.6 F (37 C)     Temp Source 04/09/17 2030 Oral     SpO2 04/09/17 2030 98 %     Weight 04/09/17 2031 201 lb (91.2 kg)     Height 04/09/17 2031 5\' 6"  (1.676 m)     Head Circumference --      Peak Flow --      Pain Score 04/09/17 2030 9     Pain Loc --      Pain Edu? --      Excl. in [place]? --    Constitutional: Alert and oriented. Well appearing and in no distress. Eyes: Conjunctivae are normal. Normal extraocular movements. ENT   Head: Normocephalic and atraumatic.   Nose: No congestion/rhinnorhea.   Mouth/Throat: Mucous membranes are moist.   Neck: No stridor.  Cardiovascular: Normal rate, regular rhythm. No murmurs, rubs, or gallops. Respiratory: Normal respiratory effort without tachypnea nor retractions. Breath sounds are clear and equal bilaterally. No wheezes/rales/rhonchi. Gastrointestinal: Soft and nontender. Normal bowel sounds Musculoskeletal: Nontender with normal range of motion in extremities. No lower extremity tenderness nor edema. Neurologic:  Normal speech and language. No gross focal neurologic deficits are appreciated.  Skin:  Skin is warm, dry and intact. No rash noted. Psychiatric: Depressed mood and  affect ____________________________________________  ED COURSE:  As part of my medical decision making, I reviewed the following data within the [place] History obtained from family if available, nursing notes, old chart and ekg, as well as notes from prior ED visits. Patient presented for persistent headache, we will assess with labs and imaging as indicated at this time.   Procedures ____________________________________________   LABS (pertinent positives/negatives)  Labs Reviewed  HCG, QUANTITATIVE, PREGNANCY  ____________________________________________  DIFFERENTIAL DIAGNOSIS   tension headache, cluster headache, migraine, anxiety, depression  FINAL ASSESSMENT AND PLAN  Headache   Plan: The patient had presented for persistent headache.  Patient has had a recent abortion and is still having vaginal bleeding.  She certainly seems to be under a lot of stress and is under the care of a neurologist for these headaches.  She was given a shot of Dilaudid here and given oral Valium.  I have encouraged her to have close outpatient follow-up with her neurologist.   Laurence Aly, MD   Note: This note was generated in part or whole with voice recognition software. Voice recognition is usually quite accurate but there are transcription errors that can and very often do occur. I apologize for any typographical errors that were not detected and corrected.     Earleen Newport, MD 04/09/17 2251

## 2017-04-09 NOTE — Telephone Encounter (Signed)
Pt desires call back ASAP; states she is at work but can barely walk.

## 2017-04-09 NOTE — ED Triage Notes (Signed)
Patient ambulatory to triage with steady gait, without difficulty or distress noted; pt reports generalized migraine since yesterday accomp by nausea

## 2017-04-10 ENCOUNTER — Ambulatory Visit: Payer: Medicaid Other | Admitting: Obstetrics and Gynecology

## 2017-04-20 ENCOUNTER — Other Ambulatory Visit: Payer: Self-pay

## 2017-04-20 ENCOUNTER — Encounter: Payer: Self-pay | Admitting: Neurology

## 2017-04-20 ENCOUNTER — Ambulatory Visit: Payer: Medicaid Other | Admitting: Neurology

## 2017-04-20 VITALS — BP 110/68 | HR 94 | Ht 66.0 in | Wt 205.0 lb

## 2017-04-20 DIAGNOSIS — G43711 Chronic migraine without aura, intractable, with status migrainosus: Secondary | ICD-10-CM

## 2017-04-20 MED ORDER — ERENUMAB-AOOE 70 MG/ML ~~LOC~~ SOAJ: 140.0000 mg | SUBCUTANEOUS | 4 refills | Status: AC

## 2017-04-20 MED ORDER — NORTRIPTYLINE HCL 10 MG PO CAPS: 20.0000 mg | ORAL_CAPSULE | Freq: Every day | ORAL | 3 refills | Status: DC

## 2017-04-20 NOTE — Patient Instructions (Addendum)
   We will increase the nortriptyline to 20 mg at night, we will try to start [place] for the headache.  Call for dose adjustments of the nortriptyline.  Pamelor (nortriptyline) is an antidepressant medication that has many uses that may include headache, whiplash injuries, or for peripheral neuropathy pain. Side effects may include drowsiness, dry mouth, blurred vision, or constipation. As with any antidepressant medication, worsening depression may occur. If you had any significant side effects, please call our office. The full effects of this medication may take 7-10 days after starting the drug, or going up on the dose.

## 2017-04-20 NOTE — Progress Notes (Signed)
Reason for visit: Trauma trigger migraine  Referring physician: Dr. Shon Millet is a 37 y.o. female  History of present illness:  Denise Patrick is a 37 year old right-handed white female with a history of occasional headaches that have occurred during pregnancy only.  The patient indicates that her mother has migraine headaches but she never really had significant headaches when she was growing up.  The patient slipped while going down a ramp in November 2018 and bumped the back of her head.  The patient did not lose consciousness.  She began having some headaches following this accident, she initially was having about 2 headaches a week on average.  The headaches were coming up from the back of the neck and head, going down between the shoulder blades as well with neck stiffness.  The patient has had some worsening of the headache severity that has occurred in February 2019.  The patient is having over 15 headache days a month.  The patient has had some problems with gaining weight since the onset of the headaches.  Prior to bumping her head, the patient had been on amitriptyline taking 20 mg at night for depression problems.  The patient now is on nortriptyline only taking 10 mg at night.  She indicates that before the headache starts she may feel spacey.  The headache may last up to 3 days, and is mainly centered in the back of the head.  The patient may have nausea and vomiting, she may see spots in front of the eyes.  Bright lights and loud noises may bother her, odors are quite bothersome during the headaches as well.  The patient has intermittent numbness of the hands and forearms, she feels weak all over.  She may have some problems with incontinence of the bladder that is a chronic issue for her.  She has been tried on Topamax but claims that this did not help.  She has been taking Toradol or ibuprofen or Fioricet for the headaches.  She does not drink a lot of caffeinated products.  She  has taken Imitrex as well for the headache.  She has gone to the emergency room relatively frequently for treatment of her headache.  She is sent to this office for an evaluation.  CT scan of the brain has been done recently and has been unremarkable.  Past Medical History:  Diagnosis Date  . Anxiety   . Cancer (HCC)    Cervical  . Depression   . Headache   . History of kidney stones   . Renal disorder     Past Surgical History:  Procedure Laterality Date  . KIDNEY STONE SURGERY    . LEEP N/A 11/05/2015   Procedure: LOOP ELECTROSURGICAL EXCISION PROCEDURE (LEEP);  Surgeon: Brayton Mars, MD;  Location: [facility] ORS;  Service: Gynecology;  Laterality: N/A;  . TONSILLECTOMY      Family History  Problem Relation Age of Onset  . Hypertension Father   . Cancer Father   . Cancer Maternal Grandmother   . COPD Maternal Grandmother   . Migraines Mother     Social history:  reports that she has been smoking cigarettes.  She has a 7.50 pack-year smoking history. She has never used smokeless tobacco. She reports that she does not drink alcohol or use drugs.  Medications:  Prior to Admission medications   Medication Sig Start Date End Date Taking? Authorizing Provider  diazepam (VALIUM) 5 MG tablet Take 1 tablet by mouth  as needed. 03/25/17  Yes [provider]  ibuprofen (ADVIL,MOTRIN) 800 MG tablet Take 1 tablet (800 mg total) by mouth every 8 (eight) hours as needed. 04/09/17  Yes Harlin Heys, MD  metoCLOPramide (REGLAN) 10 MG tablet Take 1 tablet (10 mg total) by mouth 4 (four) times daily. 03/04/17  Yes Defrancesco, Alanda Slim, MD  ondansetron (ZOFRAN ODT) 4 MG disintegrating tablet Take 1 tablet (4 mg total) by mouth every 8 (eight) hours as needed for nausea or vomiting. 04/09/17  Yes Earleen Newport, MD  prochlorperazine (COMPAZINE) 5 MG tablet Take 1 tablet (5 mg total) by mouth every 6 (six) hours as needed for nausea or vomiting. 03/19/17  Yes Eula Listen, MD  SUMAtriptan (IMITREX) 100 MG tablet Take 100 mg by mouth every 2 (two) hours as needed for migraine. May repeat in 2 hours if headache persists or recurs.   Yes [provider]  Erenumab-aooe (AIMOVIG 140 DOSE) 70 MG/ML SOAJ Inject 140 mg into the skin every 30 (thirty) days. 04/20/17   Kathrynn Ducking, MD  nortriptyline (PAMELOR) 10 MG capsule Take 2 capsules (20 mg total) by mouth at bedtime. 04/20/17   Kathrynn Ducking, MD     No Known Allergies  ROS:  Out of a complete 14 system review of symptoms, the patient complains only of the following symptoms, and all other reviewed systems are negative.  Fevers, chills, weight gain, fatigue Swelling in the legs Ringing in the ears Eye pain, cough Diarrhea Urination problems Swollen lymph nodes Feeling hot, cold, flushing Joint pain, muscle cramps, aching muscles Allergies, runny nose, frequent infections Memory loss, confusion, headache, numbness, weakness, slurred speech, dizziness Depression, anxiety, too much sleep, none of sleep, disinterest in activities, suicidal thoughts Insomnia, sleepiness, shift work  Blood pressure 110/68, pulse 94, height 5\' 6"  (1.676 m), weight 205 lb (93 kg), SpO2 98 %.  Physical Exam  General: The patient is alert and cooperative at the time of the examination.  The patient is moderately obese.  Eyes: Pupils are equal, round, and reactive to light. Discs are flat bilaterally.  Neck: The neck is supple, no carotid bruits are noted.  Respiratory: The respiratory examination is clear.  Cardiovascular: The cardiovascular examination reveals a regular rate and rhythm, no obvious murmurs or rubs are noted.  Neuromuscular: Range of movement the cervical spine lacks about 15 degrees of full lateral rotation of the cervical spine  Skin: Extremities are without significant edema.  Neurologic Exam  Mental status: The patient is alert and oriented x 3 at the time of the  examination. The patient has apparent normal recent and remote memory, with an apparently normal attention span and concentration ability.  Cranial nerves: Facial symmetry is present. There is good sensation of the face to pinprick and soft touch bilaterally. The strength of the facial muscles and the muscles to head turning and shoulder shrug are normal bilaterally. Speech is well enunciated, no aphasia or dysarthria is noted. Extraocular movements are full. Visual fields are full. The tongue is midline, and the patient has symmetric elevation of the soft palate. No obvious hearing deficits are noted.  Motor: The motor testing reveals 5 over 5 strength of all 4 extremities. Good symmetric motor tone is noted throughout.  Sensory: Sensory testing is intact to pinprick, soft touch, vibration sensation, and position sense on all 4 extremities. No evidence of extinction is noted.  Coordination: Cerebellar testing reveals good finger-nose-finger and heel-to-shin bilaterally.  Gait and station: Gait  is normal. Tandem gait is normal. Romberg is negative. No drift is seen.  Reflexes: Deep tendon reflexes are symmetric and normal bilaterally. Toes are downgoing bilaterally.   Assessment/Plan:  1.  Trauma trigger migraine  The patient appears to have migraine headaches that were initiated from a minor head injury in November 2018.  The patient may have some muscle tension component to the headache as well at this point.  The patient is on nortriptyline which should be a good medication for her, we will increase the medication to 20 mg at night, if she is willing to go higher on the dose she will contact me.  We will try to get her started on Aimovig.  The patient may go on magnesium supplementation and begin taking riboflavin 400 mg daily.  She will follow-up in 3 months.  Currently, the patient is a [employer] and she indicates that she is not performing well in class.  Jill Alexanders MD 04/20/2017 3:07  PM  [facility] [address] [place], [place] [address]  Phone [phone] Fax [phone]

## 2017-04-21 ENCOUNTER — Encounter: Payer: Self-pay | Admitting: Emergency Medicine

## 2017-04-21 ENCOUNTER — Other Ambulatory Visit: Payer: Self-pay

## 2017-04-21 ENCOUNTER — Emergency Department
Admission: EM | Admit: 2017-04-21 | Discharge: 2017-04-22 | Disposition: A | Discharge status: Home / Self Care | Payer: Medicaid Other | Attending: Emergency Medicine | Admitting: Emergency Medicine

## 2017-04-21 DIAGNOSIS — R51 Headache: Secondary | ICD-10-CM | POA: Diagnosis present

## 2017-04-21 DIAGNOSIS — Z8541 Personal history of malignant neoplasm of cervix uteri: Secondary | ICD-10-CM | POA: Insufficient documentation

## 2017-04-21 DIAGNOSIS — Z79899 Other long term (current) drug therapy: Secondary | ICD-10-CM | POA: Insufficient documentation

## 2017-04-21 DIAGNOSIS — G43811 Other migraine, intractable, with status migrainosus: Secondary | ICD-10-CM

## 2017-04-21 DIAGNOSIS — F17210 Nicotine dependence, cigarettes, uncomplicated: Secondary | ICD-10-CM | POA: Insufficient documentation

## 2017-04-21 MED ORDER — SODIUM CHLORIDE 0.9 % IV BOLUS
1000.0000 mL | Freq: Once | INTRAVENOUS | Status: AC
  Administered 2017-04-21: 1000 mL via INTRAVENOUS

## 2017-04-21 MED ORDER — METHYLPREDNISOLONE SODIUM SUCC 125 MG IJ SOLR
125.0000 mg | Freq: Once | INTRAMUSCULAR | Status: AC
Start: 2017-04-21 — End: 2017-04-21
  Administered 2017-04-21: 125 mg via INTRAVENOUS
  Filled 2017-04-21: qty 2

## 2017-04-21 MED ORDER — METOCLOPRAMIDE HCL 5 MG/ML IJ SOLN
10.0000 mg | Freq: Once | INTRAMUSCULAR | Status: AC
Start: 2017-04-21 — End: 2017-04-21
  Administered 2017-04-21: 10 mg via INTRAVENOUS
  Filled 2017-04-21: qty 2

## 2017-04-21 MED ORDER — DIPHENHYDRAMINE HCL 50 MG/ML IJ SOLN
25.0000 mg | Freq: Once | INTRAMUSCULAR | Status: AC
Start: 2017-04-21 — End: 2017-04-21
  Administered 2017-04-21: 25 mg via INTRAVENOUS
  Filled 2017-04-21: qty 1

## 2017-04-21 NOTE — ED Triage Notes (Signed)
Patient ambulatory to triage with steady gait, without difficulty or distress noted; pt reports x 2 days having right sided migraine accomp by nausea; st seen at migraine clinic yesterday and received amovig injection without relief

## 2017-04-22 MED ORDER — BUTALBITAL-APAP-CAFFEINE 50-325-40 MG PO TABS
2.0000 | ORAL_TABLET | Freq: Once | ORAL | Status: DC
  Filled 2017-04-22: qty 2

## 2017-04-22 MED ORDER — MAGNESIUM SULFATE 2 GM/50ML IV SOLN
2.0000 g | Freq: Once | INTRAVENOUS | Status: DC
  Filled 2017-04-22: qty 50

## 2017-04-22 MED ORDER — PREDNISONE 10 MG (21) PO TBPK: ORAL_TABLET | ORAL | 0 refills | Status: DC

## 2017-04-22 MED ORDER — CEPHALEXIN 500 MG PO CAPS: 500.0000 mg | ORAL_CAPSULE | Freq: Three times a day (TID) | ORAL | 0 refills | Status: AC

## 2017-04-22 NOTE — Discharge Instructions (Addendum)
Please follow-up with your neurologist for further treatment of your migraine headache.  Please return with any worsening condition or any other concerns.

## 2017-04-22 NOTE — ED Notes (Signed)
This RN at bedside to administer medication. Patient states, "I've already taken fioricet at home and it did nothing for me. I don't want it." This RN asked patient if she would like the magnesium. Patient states, "No, I think I just want to go home. The headache doctor told me never to take fioricet and I don't want to wait on the other meds to go in so I think I'm just going to go home." This RN expressed understanding and informed patient that MD would be made aware and discharge papers typed up. Patient verbalized understanding.

## 2017-04-22 NOTE — ED Provider Notes (Signed)
[facility] Emergency Department Provider Note   ____________________________________________   First MD Initiated Contact with Patient 04/21/17 2301     (approximate)  I have reviewed the triage vital signs and the nursing notes.   HISTORY  Chief Complaint Migraine    HPI Denise Patrick is a 37 y.o. female who comes into the hospital today with a migraine headache.  The patient has a history of migraines and sees neurology for treatment.  The patient reports that the headache started yesterday.  She states that she went to the neurologist office and was given 2 shots of Amiovig.  The patient states that she took 2 Imitrex today as well as some Reglan and ibuprofen but it has not taken away the headache.  The patient is feeling some nausea and some neck stiffness.  The patient feels like there is a knot on the back of her head.  The patient is sensitive to light and sounds.  Currently the patient's pain is an 8 out of 10 in intensity.  The patient denies that this headache is the worst in her life.  She states that she has migraines frequently.  The patient was seeing Dr. Brigitte Pulse as a neurologist but now goes to [facility] neurology.  She has no numbness no tingling no fevers.  Past Medical History:  Diagnosis Date  . Anxiety   . Cancer (HCC)    Cervical  . Depression   . Headache   . History of kidney stones   . Renal disorder     Patient Active Problem List   Diagnosis Date Noted  . UTI (urinary tract infection) 03/08/2017  . Positive urine pregnancy test 03/04/2017  . 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 Severe dysplasia of cervix (CIN III) 09/27/2015  . Migraine 09/12/2015  . Endometriosis 09/06/2015  . Dysmenorrhea 09/06/2015  . ASCUS with positive high risk HPV 09/06/2015  . Tobacco user 09/06/2015  . Upper back pain 05/01/2015  . Vision changes 05/01/2015  . Tooth pain 05/01/2015    Past Surgical History:  Procedure Laterality Date  . KIDNEY  STONE SURGERY    . LEEP N/A 11/05/2015   Procedure: LOOP ELECTROSURGICAL EXCISION PROCEDURE (LEEP);  Surgeon: Brayton Mars, MD;  Location: [facility] ORS;  Service: Gynecology;  Laterality: N/A;  . TONSILLECTOMY      Prior to Admission medications   Medication Sig Start Date End Date Taking? Authorizing Provider  cephALEXin (KEFLEX) 500 MG capsule Take 1 capsule (500 mg total) by mouth 3 (three) times daily for 7 days. 04/22/17 04/29/17  Loney Hering, MD  diazepam (VALIUM) 5 MG tablet Take 1 tablet by mouth as needed. 03/25/17   [provider]  Erenumab-aooe (AIMOVIG 140 DOSE) 70 MG/ML SOAJ Inject 140 mg into the skin every 30 (thirty) days. 04/20/17   Kathrynn Ducking, MD  ibuprofen (ADVIL,MOTRIN) 800 MG tablet Take 1 tablet (800 mg total) by mouth every 8 (eight) hours as needed. 04/09/17   Harlin Heys, MD  metoCLOPramide (REGLAN) 10 MG tablet Take 1 tablet (10 mg total) by mouth 4 (four) times daily. 03/04/17   Defrancesco, Alanda Slim, MD  nortriptyline (PAMELOR) 10 MG capsule Take 2 capsules (20 mg total) by mouth at bedtime. 04/20/17   Kathrynn Ducking, MD  ondansetron (ZOFRAN ODT) 4 MG disintegrating tablet Take 1 tablet (4 mg total) by mouth every 8 (eight) hours as needed for nausea or vomiting. 04/09/17   Earleen Newport, MD  predniSONE [facility]  UNI-PAK 21 TAB) 10 MG (21) TBPK tablet Take 6 tabs on day 1 Take 5 tabs on day 2 Take 4 tabs on day 3 Take 3 tabs on day 4 Take 2 tabs on day 5 Take 1 tab on day 6 04/22/17   Loney Hering, MD  prochlorperazine (COMPAZINE) 5 MG tablet Take 1 tablet (5 mg total) by mouth every 6 (six) hours as needed for nausea or vomiting. 03/19/17   Eula Listen, MD  SUMAtriptan (IMITREX) 100 MG tablet Take 100 mg by mouth every 2 (two) hours as needed for migraine. May repeat in 2 hours if headache persists or recurs.    [provider]    Allergies Patient has no known allergies.  Family History  Problem  Relation Age of Onset  . Hypertension Father   . Cancer Father   . Cancer Maternal Grandmother   . COPD Maternal Grandmother   . Migraines Mother     Social History Social History   Tobacco Use  . Smoking status: Current Every Day Smoker    Packs/day: 0.50    Years: 15.00    Pack years: 7.50    Types: Cigarettes  . Smokeless tobacco: Never Used  Substance Use Topics  . Alcohol use: No  . Drug use: No    Review of Systems  Constitutional: No fever/chills Eyes: No visual changes. ENT: No sore throat. Cardiovascular: Denies chest pain. Respiratory: Denies shortness of breath. Gastrointestinal: Nausea with no abdominal pain.  no vomiting.  No diarrhea.  No constipation. Genitourinary: Negative for dysuria. Musculoskeletal: Negative for back pain. Skin: Negative for rash. Neurological: Headache   ____________________________________________   PHYSICAL EXAM:  VITAL SIGNS: ED Triage Vitals  Enc Vitals Group     BP 04/21/17 2225 114/80     Pulse Rate 04/21/17 2225 88     Resp 04/21/17 2225 18     Temp 04/21/17 2225 98 F (36.7 C)     Temp Source 04/21/17 2225 Oral     SpO2 04/21/17 2225 98 %     Weight 04/21/17 2224 205 lb (93 kg)     Height 04/21/17 2224 5\' 6"  (1.676 m)     Head Circumference --      Peak Flow --      Pain Score 04/21/17 2224 8     Pain Loc --      Pain Edu? --      Excl. in [place]? --     Constitutional: Alert and oriented. Well appearing and in moderate distress. Eyes: Conjunctivae are normal. PERRL. EOMI. Head: Atraumatic. Nose: No congestion/rhinnorhea. Mouth/Throat: Mucous membranes are moist.  Oropharynx non-erythematous. Cardiovascular: Normal rate, regular rhythm. Grossly normal heart sounds.  Good peripheral circulation. Respiratory: Normal respiratory effort.  No retractions. Lungs CTAB. Gastrointestinal: Soft and nontender. No distention. Positive bowel sounds Musculoskeletal: No lower extremity tenderness nor edema.     Neurologic:  Normal speech and language.  Cranial nerves II through XII are grossly intact with no focal motor neuro deficit Skin:  Skin is warm, dry and intact.  Psychiatric: Mood and affect are normal.   ____________________________________________   LABS (all labs ordered are listed, but only abnormal results are displayed)  Labs Reviewed - No data to display ____________________________________________  EKG  none ____________________________________________  RADIOLOGY  ED MD interpretation:  none  Official radiology report(s): No results found.  ____________________________________________   PROCEDURES  Procedure(s) performed: None  Procedures  Critical Care performed: No  ____________________________________________   INITIAL IMPRESSION /  ASSESSMENT AND PLAN / ED COURSE  As part of my medical decision making, I reviewed the following data within the electronic MEDICAL RECORD NUMBER Notes from prior ED visits and  Controlled Substance Database   This is a 37 year old female who comes into the hospital today with migraine headache.  The patient has a history of migraines.  I did initially give the patient some Reglan, Benadryl Solu-Medrol and a liter of normal saline. The patient was reassessed and her pain had only slightly improved.  I ordered some magnesium sulfate and Fioricet for the patient which she refused.  The patient then stated that she felt like she wanted to go home.  The patient will be discharged and encouraged to follow-up with her neurologist.  The patient was seen on April 1 by neurology and they have been discussing plans to help control the patient's migraines.  She will be discharged to follow-up.      ____________________________________________   FINAL CLINICAL IMPRESSION(S) / ED DIAGNOSES  Final diagnoses:  Other migraine with status migrainosus, intractable     ED Discharge Orders        Ordered    predniSONE (STERAPRED  UNI-PAK 21 TAB) 10 MG (21) TBPK tablet     04/22/17 0107    cephALEXin (KEFLEX) 500 MG capsule  3 times daily     04/22/17 0114       Note:  This document was prepared using Dragon voice recognition software and may include unintentional dictation errors.    Loney Hering, MD 04/22/17 [phone]

## 2017-04-22 NOTE — ED Notes (Signed)
Patient ambulatory with NAD noted. Verbalized understanding of discharge instructions, prescriptions, and follow-up care.

## 2017-04-23 ENCOUNTER — Telehealth: Payer: Self-pay | Admitting: Neurology

## 2017-04-23 MED ORDER — GABAPENTIN 100 MG PO CAPS: 100.0000 mg | ORAL_CAPSULE | Freq: Three times a day (TID) | ORAL | 3 refills | Status: DC

## 2017-04-23 NOTE — Telephone Encounter (Signed)
Pt called stating Dr. Jannifer Franklin mentioned getting an MRI pt would like to get this scheduled. Also stating that Aimovig hasn't helped stating the headaches have progressed further. Pt would like to also change her dosing for nortriptyline. Please call to advise

## 2017-04-23 NOTE — Telephone Encounter (Signed)
I called the patient.  The patient was seen 3 days ago, she went up on her nortriptyline to 20 mg at night, claims that she has had drowsiness during the day.  The patient has not been able to work well because of this.  She will stay on the nortriptyline and hopefully get acclimated to it.  She was able to tolerate 20 mg of amitriptyline previously.  The patient will be placed on low-dose gabapentin taking 100 mg 3 times daily.  The patient has been placed on prednisone through the emergency room which has helped her neck pain some.  The patient will call for any dose adjustments of the nortriptyline or gabapentin.

## 2017-04-24 ENCOUNTER — Other Ambulatory Visit: Payer: Self-pay | Admitting: Neurology

## 2017-04-24 DIAGNOSIS — G43509 Persistent migraine aura without cerebral infarction, not intractable, without status migrainosus: Secondary | ICD-10-CM

## 2017-04-29 ENCOUNTER — Emergency Department: Payer: Medicaid Other

## 2017-04-29 ENCOUNTER — Other Ambulatory Visit: Payer: Self-pay

## 2017-04-29 ENCOUNTER — Emergency Department
Admission: EM | Admit: 2017-04-29 | Discharge: 2017-04-29 | Disposition: A | Discharge status: Home / Self Care | Payer: Medicaid Other | Attending: Emergency Medicine | Admitting: Emergency Medicine

## 2017-04-29 DIAGNOSIS — F17210 Nicotine dependence, cigarettes, uncomplicated: Secondary | ICD-10-CM | POA: Insufficient documentation

## 2017-04-29 DIAGNOSIS — Z79899 Other long term (current) drug therapy: Secondary | ICD-10-CM | POA: Diagnosis not present

## 2017-04-29 DIAGNOSIS — Y929 Unspecified place or not applicable: Secondary | ICD-10-CM | POA: Insufficient documentation

## 2017-04-29 DIAGNOSIS — Y939 Activity, unspecified: Secondary | ICD-10-CM | POA: Insufficient documentation

## 2017-04-29 DIAGNOSIS — Y998 Other external cause status: Secondary | ICD-10-CM | POA: Diagnosis not present

## 2017-04-29 DIAGNOSIS — S0990XA Unspecified injury of head, initial encounter: Secondary | ICD-10-CM | POA: Diagnosis present
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
## 2017-04-29 NOTE — ED Notes (Signed)
D/w Dr. Jimmye Norman, new order for head CT, no other orders at this time.

## 2017-05-01 ENCOUNTER — Ambulatory Visit
Admission: RE | Admit: 2017-05-01 | Discharge: 2017-05-01 | Disposition: A | Discharge status: Home / Self Care | Payer: Medicaid Other | Source: Ambulatory Visit | Attending: Neurology | Admitting: Neurology

## 2017-05-01 DIAGNOSIS — G43509 Persistent migraine aura without cerebral infarction, not intractable, without status migrainosus: Secondary | ICD-10-CM | POA: Diagnosis not present

## 2017-05-01 MED ORDER — GADOBENATE DIMEGLUMINE 529 MG/ML IV SOLN
20.0000 mL | Freq: Once | INTRAVENOUS | Status: AC | PRN
  Administered 2017-05-01: 19 mL via INTRAVENOUS

## 2017-05-06 ENCOUNTER — Telehealth: Payer: Self-pay | Admitting: Neurology

## 2017-05-06 DIAGNOSIS — M542 Cervicalgia: Secondary | ICD-10-CM

## 2017-05-06 MED ORDER — PREDNISONE 5 MG PO TABS: ORAL_TABLET | ORAL | 0 refills | Status: DC

## 2017-05-06 NOTE — Telephone Encounter (Signed)
I called the patient.  The patient is having mainly neck pain at this point, since starting the Aimovig, her headaches have disappeared but her neck is now the problem.  She has a lot of stiffness, she cannot tolerate higher doses of medications as she is quite drowsy throughout the day, she is on gabapentin and nortriptyline.  Prednisone previously helped, but we cannot keep giving her prednisone again and again.  I will give her another 6-day course but we will get her set up for physical therapy as well, will get MRI evaluation of the cervical spine.  The patient has reported intermittent numbness of the hands.

## 2017-05-06 NOTE — Telephone Encounter (Signed)
I called the patient, left a message, I will call back later. 

## 2017-05-06 NOTE — Telephone Encounter (Signed)
I called the patient. I left a message again, I will call back later.

## 2017-05-06 NOTE — Telephone Encounter (Signed)
Pt called requesting a call back from RN stating she has been having intense pressure and pain in her neck and would like something to help with the pain, preferably prednisone. Please call to discuss

## 2017-05-06 NOTE — Telephone Encounter (Signed)
Medicaid order sent to GI. They obtain auth and will reach out to the pt to schedule.

## 2017-05-14 NOTE — Telephone Encounter (Signed)
Noted, I started the case with Medicaid. Case # [id number]. It is pending faxed clinical notes.

## 2017-05-14 NOTE — Telephone Encounter (Signed)
I called pt back. I asked if she has scheduled MRI cervical or physical therapy yet that Dr. Jannifer Franklin ordered. She has not. Advised MRI order sent to [facility] and they tried to contact her to schedule on 05/08/17. She requested to go to [facility] to having imaging. She previously had imaging done via them. Advised I will send message Astrid Divine (MRI coordinator) to get this set up.   I advised her PT order was sent to [facility]. Address: [address] [place], [place] [address] Phone: [phone]. I offered her this info but she stated she will look it up herself.

## 2017-05-14 NOTE — Telephone Encounter (Signed)
Pt called she is still having continued neck pain, prednisone is not helping. Please call to advise.

## 2017-05-15 ENCOUNTER — Ambulatory Visit: Payer: Medicaid Other | Admitting: Neurology

## 2017-05-15 NOTE — Telephone Encounter (Signed)
I called [employer] to check the status of the case it is still pending they should have a decision made by the end of today.

## 2017-05-18 ENCOUNTER — Telehealth: Payer: Self-pay | Admitting: *Deleted

## 2017-05-18 NOTE — Telephone Encounter (Signed)
Dr. Brett Fairy- Pt requesting muscle relaxer. Dr. Jannifer Franklin out of office today. You are WID this am. Are you ok with calling this in for pt? Referral to PT pending. MRI scheduled for 05/21/17.

## 2017-05-18 NOTE — Telephone Encounter (Signed)
PA Aimovig submitted via phone w/ [employer] (phone: [phone]). Spoke w/ Joelene Millin. Pt has tried/failed: ibuprofen, tylenol, amitriptyline, nortriptyline, topamax, diazepam, fluoxetine, toradol, imitrex, meloxicam. Dx: C12.751 (Chronic migraine).  PA will be good for 3 months since this is initial therapy. PA submitted to pharmacist for review. Can check status of PA after 24hr. Waiting on determination.  PA#: [id number] Ref# for call: [id number]

## 2017-05-18 NOTE — Telephone Encounter (Signed)
Medicaid approved the MRI. The Auth is [id number] (exp. 05/14/17 to 06/13/17). Patient is scheduled at [facility] for Thursday 05/21/17 arrival time is 3:30 pm. Patient is aware of time & day.  She also informed me that she is in pain with her migraines. That when the medicine wears off she can feel her muscle's hurting really bad and she wants to know why no one has giving her a muscle relaxer.

## 2017-05-19 MED ORDER — METHOCARBAMOL 500 MG PO TABS: 500.0000 mg | ORAL_TABLET | Freq: Three times a day (TID) | ORAL | 1 refills | Status: DC

## 2017-05-19 NOTE — Telephone Encounter (Signed)
I called the patient.  The patient still have a lot of muscle spasm issues in her neck, MRI of the cervical spine will be done on 21 May 2017.  The prior authorization for the Aimovig has been done, she has not yet gotten the medication.  She has not heard anything about her physical therapy that was to be done at a [place] office, will need to recheck on this.  I will send in a prescription for methocarbamol for her neck.  The patient is complaining of a lot of blurred vision on nortriptyline.

## 2017-05-19 NOTE — Telephone Encounter (Signed)
I have called and left [facility] a message asking them about patient's apt. [phone]. Thanks Hinton Dyer.

## 2017-05-19 NOTE — Addendum Note (Signed)
Addended by: Kathrynn Ducking on: 05/19/2017 10:22 AM   Modules accepted: Orders

## 2017-05-19 NOTE — Telephone Encounter (Signed)
Jayme Cloud- Can you check on PT referral that was sent? Pt has not heard, thank you!

## 2017-05-19 NOTE — Telephone Encounter (Signed)
Dr. Jannifer Franklin- Can you please take a look at this request from yesterday?

## 2017-05-19 NOTE — Telephone Encounter (Signed)
Tizanidine can be helpful, but this patient has already DIAZEPAM listed. This should be her muscle relaxant for now- until Dr Jannifer Franklin can address this .CD

## 2017-05-19 NOTE — Telephone Encounter (Signed)
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

## 2017-05-20 NOTE — Telephone Encounter (Signed)
Called [employer] and spoke w/ Gene to check on status of PA. PA approved effective 05/18/17-08/16/17. JK#[id number]. Interaction ID# for call: [id number].  Faxed notice of approval to [employer] in [place], [place] at [phone]. Received fax confirmation.

## 2017-05-21 ENCOUNTER — Ambulatory Visit
Admission: RE | Admit: 2017-05-21 | Discharge: 2017-05-21 | Disposition: A | Discharge status: Home / Self Care | Payer: Medicaid Other | Source: Ambulatory Visit | Attending: Neurology | Admitting: Neurology

## 2017-05-21 DIAGNOSIS — M50321 Other cervical disc degeneration at C4-C5 level: Secondary | ICD-10-CM | POA: Diagnosis not present

## 2017-05-21 DIAGNOSIS — M542 Cervicalgia: Secondary | ICD-10-CM | POA: Diagnosis present

## 2017-05-21 DIAGNOSIS — M47812 Spondylosis without myelopathy or radiculopathy, cervical region: Secondary | ICD-10-CM | POA: Diagnosis not present

## 2017-05-22 ENCOUNTER — Telehealth: Payer: Self-pay | Admitting: Neurology

## 2017-05-22 DIAGNOSIS — M4722 Other spondylosis with radiculopathy, cervical region: Secondary | ICD-10-CM

## 2017-05-22 NOTE — Telephone Encounter (Signed)
I called patient.  The MRI of the neck shows degenerative disc disease, some possible impingement mainly to the right side at the C5-6 level with neuroforaminal stenosis.  If the patient desires, we can get an epidural steroid injection set up.  She will call if she wishes to do this.   MRI cervical 05/22/17:  IMPRESSION: 1. 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

## 2017-05-25 NOTE — Telephone Encounter (Signed)
[place] has tried to call patient serval   times and patient has not called back.

## 2017-05-25 NOTE — Telephone Encounter (Signed)
I called the patient.  We discussed the results of the MRI.  The spondylitic changes are most severe at C5-6 level, we will consider an epidural steroid injection.  If this is not helped, and the patient is in severe pain, we will consider a neurosurgical referral.  The patient indicates that she fell at work and has had neck pain and headaches since that time, she apparently is going to file a workman's compensation claim for this.  I will get an epidural steroid injection set up for the patient.

## 2017-05-25 NOTE — Telephone Encounter (Signed)
Pt has returned the call to Dr Jannifer Franklin, she is asking for a call back to speak with him in response to the findings of the MRI and to discuss further the idea of the epidural steroid injection.  Please call pt.

## 2017-05-25 NOTE — Addendum Note (Signed)
Addended by: Kathrynn Ducking on: 05/25/2017 02:05 PM   Modules accepted: Orders

## 2017-05-27 ENCOUNTER — Ambulatory Visit: Payer: Medicaid Other | Admitting: Neurology

## 2017-05-27 ENCOUNTER — Other Ambulatory Visit: Payer: Self-pay | Admitting: Neurology

## 2017-05-27 DIAGNOSIS — M542 Cervicalgia: Secondary | ICD-10-CM

## 2017-05-28 MED ORDER — GABAPENTIN 100 MG PO CAPS: ORAL_CAPSULE | ORAL | 3 refills | Status: DC

## 2017-05-28 NOTE — Addendum Note (Signed)
Addended by: Kathrynn Ducking on: 05/28/2017 05:10 PM   Modules accepted: Orders

## 2017-05-28 NOTE — Telephone Encounter (Signed)
I called the patient.  The patient is still having ongoing pain, she claims that the medications that she is on is resulting in some drowsiness during the day, she is on a pediatric dose of the gabapentin, will go to 100 mg in the morning and midday, 300 mg in the evening.  The patient will be having an epidural steroid injection on 20 May.  If the epidural injection is not effective, the patient is amenable to seeing a neurosurgeon.

## 2017-05-28 NOTE — Telephone Encounter (Signed)
Pt has called back asking that Dr Jannifer Franklin calls her back because she is in extreme pain re: bulging disc 5 & 6

## 2017-06-01 ENCOUNTER — Ambulatory Visit
Admission: EM | Admit: 2017-06-01 | Discharge: 2017-06-01 | Disposition: A | Discharge status: Home / Self Care | Payer: Medicaid Other | Attending: Family Medicine | Admitting: Family Medicine

## 2017-06-01 ENCOUNTER — Other Ambulatory Visit: Payer: Self-pay

## 2017-06-01 ENCOUNTER — Encounter: Payer: Self-pay | Admitting: Emergency Medicine

## 2017-06-01 DIAGNOSIS — R5383 Other fatigue: Secondary | ICD-10-CM

## 2017-06-01 DIAGNOSIS — R69 Illness, unspecified: Secondary | ICD-10-CM

## 2017-06-01 DIAGNOSIS — M791 Myalgia, unspecified site: Secondary | ICD-10-CM

## 2017-06-01 DIAGNOSIS — R51 Headache: Secondary | ICD-10-CM | POA: Diagnosis not present

## 2017-06-01 DIAGNOSIS — H6001 Abscess of right external ear: Secondary | ICD-10-CM | POA: Diagnosis not present

## 2017-06-01 DIAGNOSIS — J029 Acute pharyngitis, unspecified: Secondary | ICD-10-CM | POA: Diagnosis not present

## 2017-06-01 DIAGNOSIS — J111 Influenza due to unidentified influenza virus with other respiratory manifestations: Secondary | ICD-10-CM

## 2017-06-01 HISTORY — DX: Migraine, unspecified, not intractable, without status migrainosus: G43.909

## 2017-06-01 MED ORDER — LIDOCAINE VISCOUS HCL 2 % MT SOLN: 15.0000 mL | Freq: Four times a day (QID) | OROMUCOSAL | 0 refills | Status: DC | PRN

## 2017-06-01 MED ORDER — AMOXICILLIN 500 MG PO CAPS: 500.0000 mg | ORAL_CAPSULE | Freq: Three times a day (TID) | ORAL | 0 refills | Status: DC

## 2017-06-01 MED ORDER — OSELTAMIVIR PHOSPHATE 75 MG PO CAPS: 75.0000 mg | ORAL_CAPSULE | Freq: Two times a day (BID) | ORAL | 0 refills | Status: DC

## 2017-06-01 NOTE — ED Triage Notes (Addendum)
Patient in today c/o cough, sore throat, headache, body aches, chills and sweats, but hasn't taken temperature x 2 days. Patient had 2 Tamiflu tablets left over from previous illness and took 1 on Saturday and 1 on Sunday. Patient's last Tylenol was ~ 3 hours ago.  Patient also c/o cyst/pimple behind her right ear x 4-5 days.

## 2017-06-01 NOTE — ED Provider Notes (Signed)
[facility]    CSN: [id number] Arrival date & time: 06/01/17  1322     History   Chief Complaint Chief Complaint  Patient presents with  . Cough    HPI Denise Patrick is a 37 y.o. female.   Also c/o tender lump on skin behind right ear.   The history is provided by the patient.  Cough  Associated symptoms: fever, headaches, myalgias, rhinorrhea and sore throat   Associated symptoms: no wheezing   URI  Presenting symptoms: cough, fatigue, fever, rhinorrhea and sore throat   Severity:  Moderate Onset quality:  Sudden Duration:  3 days Timing:  Constant Progression:  Worsening Chronicity:  New Relieved by: tamiflu; started taking "some leftover tamiflu 2 days ago" Associated symptoms: headaches and myalgias   Associated symptoms: no wheezing   Risk factors: sick contacts   Risk factors: not elderly, no chronic cardiac disease, no chronic kidney disease, no chronic respiratory disease, no diabetes mellitus, no immunosuppression, no recent illness and no recent travel     Past Medical History:  Diagnosis Date  . Anxiety   . Cancer (HCC)    Cervical  . Depression   . Headache   . History of kidney stones   . Migraine   . Renal disorder     Patient Active Problem List   Diagnosis Date Noted  . UTI (urinary tract infection) 03/08/2017  . Positive urine pregnancy test 03/04/2017  . 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 Severe dysplasia of cervix (CIN III) 09/27/2015  . Migraine 09/12/2015  . Endometriosis 09/06/2015  . Dysmenorrhea 09/06/2015  . ASCUS with positive high risk HPV 09/06/2015  . Tobacco user 09/06/2015  . Upper back pain 05/01/2015  . Vision changes 05/01/2015  . Tooth pain 05/01/2015    Past Surgical History:  Procedure Laterality Date  . KIDNEY STONE SURGERY    . LEEP N/A 11/05/2015   Procedure: LOOP ELECTROSURGICAL EXCISION PROCEDURE (LEEP);  Surgeon: Brayton Mars, MD;  Location: [facility];  Service: Gynecology;   Laterality: N/A;  . TONSILLECTOMY      OB History    Gravida  2   Para  2   Term  2   Preterm      AB      Living  2     SAB      TAB      Ectopic      Multiple      Live Births  2            Home Medications    Prior to Admission medications   Medication Sig Start Date End Date Taking? Authorizing Provider  diazepam (VALIUM) 5 MG tablet Take 1 tablet by mouth as needed. 03/25/17  Yes [provider]  Erenumab-aooe (AIMOVIG 140 DOSE) 70 MG/ML SOAJ Inject 140 mg into the skin every 30 (thirty) days. 04/20/17  Yes Kathrynn Ducking, MD  gabapentin (NEURONTIN) 100 MG capsule 1 capsule in the morning and midday, 3 at night 05/28/17  Yes Kathrynn Ducking, MD  methocarbamol (ROBAXIN) 500 MG tablet Take 1 tablet (500 mg total) by mouth 3 (three) times daily. 05/19/17  Yes Kathrynn Ducking, MD  amoxicillin (AMOXIL) 500 MG capsule Take 1 capsule (500 mg total) by mouth 3 (three) times daily. 06/01/17   Norval Gable, MD  ibuprofen (ADVIL,MOTRIN) 800 MG tablet Take 1 tablet (800 mg total) by mouth every 8 (eight) hours as needed. 04/09/17   Harlin Heys, MD  lidocaine (XYLOCAINE) 2 % solution Use as directed 15 mLs in the mouth or throat every 6 (six) hours as needed for mouth pain. Gargle and spit 06/01/17   Norval Gable, MD  metoCLOPramide (REGLAN) 10 MG tablet Take 1 tablet (10 mg total) by mouth 4 (four) times daily. 03/04/17   Defrancesco, Alanda Slim, MD  nortriptyline (PAMELOR) 10 MG capsule Take 2 capsules (20 mg total) by mouth at bedtime. 04/20/17   Kathrynn Ducking, MD  ondansetron (ZOFRAN ODT) 4 MG disintegrating tablet Take 1 tablet (4 mg total) by mouth every 8 (eight) hours as needed for nausea or vomiting. 04/09/17   Earleen Newport, MD  oseltamivir (TAMIFLU) 75 MG capsule Take 1 capsule (75 mg total) by mouth 2 (two) times daily. 06/01/17   Norval Gable, MD  prochlorperazine (COMPAZINE) 5 MG tablet Take 1 tablet (5 mg total) by mouth every 6 (six)  hours as needed for nausea or vomiting. 03/19/17   Eula Listen, MD  SUMAtriptan (IMITREX) 100 MG tablet Take 100 mg by mouth every 2 (two) hours as needed for migraine. May repeat in 2 hours if headache persists or recurs.    [provider]    Family History Family History  Problem Relation Age of Onset  . Hypertension Father   . Cancer Father   . Cancer Maternal Grandmother   . COPD Maternal Grandmother   . Migraines Mother     Social History Social History   Tobacco Use  . Smoking status: Current Every Day Smoker    Packs/day: 0.50    Years: 15.00    Pack years: 7.50    Types: Cigarettes  . Smokeless tobacco: Never Used  Substance Use Topics  . Alcohol use: No  . Drug use: No     Allergies   Prednisone   Review of Systems Review of Systems  Constitutional: Positive for fatigue and fever.  HENT: Positive for rhinorrhea and sore throat.   Respiratory: Positive for cough. Negative for wheezing.   Musculoskeletal: Positive for myalgias.  Neurological: Positive for headaches.     Physical Exam Triage Vital Signs ED Triage Vitals  Enc Vitals Group     BP 06/01/17 1334 127/81     Pulse Rate 06/01/17 1334 93     Resp 06/01/17 1334 16     Temp 06/01/17 1334 99.2 F (37.3 C)     Temp Source 06/01/17 1334 Oral     SpO2 06/01/17 1334 99 %     Weight 06/01/17 1335 202 lb (91.6 kg)     Height 06/01/17 1335 5\' 6"  (1.676 m)     Head Circumference --      Peak Flow --      Pain Score 06/01/17 1334 8     Pain Loc --      Pain Edu? --      Excl. in [place]? --    No data found.  Updated Vital Signs BP 127/81 (BP Location: Left Arm)   Pulse 93   Temp 99.2 F (37.3 C) (Oral)   Resp 16   Ht 5\' 6"  (1.676 m)   Wt 202 lb (91.6 kg)   LMP 05/28/2017 (Approximate)   SpO2 99%   BMI 32.60 kg/m   Visual Acuity Right Eye Distance:   Left Eye Distance:   Bilateral Distance:    Right Eye Near:   Left Eye Near:    Bilateral Near:     Physical Exam    Constitutional: She appears  well-developed and well-nourished. No distress.  HENT:  Head: Normocephalic and atraumatic.  Right Ear: Tympanic membrane, external ear and ear canal normal.  Left Ear: Tympanic membrane, external ear and ear canal normal.  Nose: No mucosal edema, rhinorrhea, nose lacerations, sinus tenderness, nasal deformity, septal deviation or nasal septal hematoma. No epistaxis.  No foreign bodies. Right sinus exhibits no maxillary sinus tenderness and no frontal sinus tenderness. Left sinus exhibits no maxillary sinus tenderness and no frontal sinus tenderness.  Mouth/Throat: Uvula is midline, oropharynx is clear and moist and mucous membranes are normal. No oropharyngeal exudate.  Eyes: Right eye exhibits no discharge. Left eye exhibits no discharge. No scleral icterus.  Neck: Normal range of motion. Neck supple. No thyromegaly present.  Cardiovascular: Normal rate, regular rhythm and normal heart sounds.  Pulmonary/Chest: Effort normal and breath sounds normal. No stridor. No respiratory distress. She has no wheezes. She has no rales.  Lymphadenopathy:    She has no cervical adenopathy.  Skin: She is not diaphoretic.  1cm tender, erythematous skin lesion behind right ear  Nursing note and vitals reviewed.    UC Treatments / Results  Labs (all labs ordered are listed, but only abnormal results are displayed) Labs Reviewed - No data to display  EKG None  Radiology No results found.  Procedures Procedures (including critical care time)  Medications Ordered in UC Medications - No data to display  Initial Impression / Assessment and Plan / UC Course  I have reviewed the triage vital signs and the nursing notes.  Pertinent labs & imaging results that were available during my care of the patient were reviewed by me and considered in my medical decision making (see chart for details).      Final Clinical Impressions(s) / UC Diagnoses   Final diagnoses:   Influenza-like illness  Boil, ear, right   Discharge Instructions   None    ED Prescriptions    Medication Sig Dispense Auth. Provider   oseltamivir (TAMIFLU) 75 MG capsule Take 1 capsule (75 mg total) by mouth 2 (two) times daily. 6 capsule Norval Gable, MD   amoxicillin (AMOXIL) 500 MG capsule Take 1 capsule (500 mg total) by mouth 3 (three) times daily. 21 capsule Coulter Oldaker, Linward Foster, MD   lidocaine (XYLOCAINE) 2 % solution Use as directed 15 mLs in the mouth or throat every 6 (six) hours as needed for mouth pain. Gargle and spit 100 mL Sunita Demond, Linward Foster, MD     1. diagnosis reviewed with patient 2. rx as per orders above; reviewed possible side effects, interactions, risks and benefits  3.Follow-up prn if symptoms worsen or don't improve   Controlled Substance Prescriptions  Controlled Substance Registry consulted? Not Applicable   Norval Gable, MD 06/01/17 1434

## 2017-06-03 ENCOUNTER — Telehealth: Payer: Self-pay | Admitting: Neurology

## 2017-06-03 DIAGNOSIS — M47812 Spondylosis without myelopathy or radiculopathy, cervical region: Secondary | ICD-10-CM

## 2017-06-03 NOTE — Telephone Encounter (Signed)
Dr. Jannifer Franklin- pt requesting call from you, thank you. I called pt back. Advised she can take imitrex if she has already taken aimovig. Explained to pt these are two different classes of drugs. Aimovig is a preventative medication for migraines and imitrex is a rescue for breakthrough migraines. I asked if she has tried taking robaxin given by Dr. Jannifer Franklin. She has not. She states that she is having vision problems. She saw eye doctor who told her she should stop nortriptyline, gabapentin, robaxin. That it iscausing her vision changes. She does not want to increase doses of medication. She states a Therapist, occupational" will be calling to discuss her case. She feels her neck pain did not start until after taking Aimovig. She would still like to take aimovig for migraines. She states she was thought migraines could cause neck pain. I explained neck pain can exacerbate migraines. She asked a bout MRI results, specifically what C5-C6 showed. I went over this per CW,MD note. She has not had ESI yet. She is aware she was referred for this. I advised that Dr. Jannifer Franklin would recommend she have this procedure to help with her sx. Pt would like a call from Dr. Jannifer Franklin to discuss. She is going to take an imitrex right now to see if she gets relief from migraine. Pt requested something stronger for pain.

## 2017-06-03 NOTE — Telephone Encounter (Signed)
The patient claims that she is having significant blurring of vision on the Pamelor, she will go down on the dose to 10 mg at night for a week and then stop the drug.  She is on gabapentin, she will be getting an epidural steroid injection on 20 May.

## 2017-06-03 NOTE — Telephone Encounter (Signed)
Pt called she awoke with a migraine today and is wanting to know if she can take imitrex with aimovig. Pt said she is in a full blown migraine, please call asap. Thank you

## 2017-06-04 ENCOUNTER — Inpatient Hospital Stay
Admission: RE | Admit: 2017-06-04 | Discharge: 2017-06-04 | Disposition: A | Discharge status: Home / Self Care | Payer: Medicaid Other | Source: Ambulatory Visit | Attending: Neurology | Admitting: Neurology

## 2017-06-08 ENCOUNTER — Other Ambulatory Visit: Payer: Medicaid Other

## 2017-06-10 ENCOUNTER — Ambulatory Visit: Payer: Medicaid Other | Attending: Neurology | Admitting: Physical Therapy

## 2017-06-10 ENCOUNTER — Encounter: Payer: Self-pay | Admitting: Physical Therapy

## 2017-06-10 DIAGNOSIS — R293 Abnormal posture: Secondary | ICD-10-CM

## 2017-06-10 DIAGNOSIS — M2560 Stiffness of unspecified joint, not elsewhere classified: Secondary | ICD-10-CM | POA: Insufficient documentation

## 2017-06-10 DIAGNOSIS — M542 Cervicalgia: Secondary | ICD-10-CM | POA: Diagnosis not present

## 2017-06-10 DIAGNOSIS — M545 Low back pain: Secondary | ICD-10-CM | POA: Insufficient documentation

## 2017-06-10 DIAGNOSIS — M6281 Muscle weakness (generalized): Secondary | ICD-10-CM | POA: Diagnosis present

## 2017-06-10 DIAGNOSIS — G8929 Other chronic pain: Secondary | ICD-10-CM | POA: Insufficient documentation
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
## 2017-06-17 ENCOUNTER — Ambulatory Visit: Payer: Medicaid Other | Admitting: Physical Therapy

## 2017-06-17 ENCOUNTER — Encounter: Payer: Self-pay | Admitting: Physical Therapy

## 2017-06-17 DIAGNOSIS — M2560 Stiffness of unspecified joint, not elsewhere classified: Secondary | ICD-10-CM

## 2017-06-17 DIAGNOSIS — G8929 Other chronic pain: Secondary | ICD-10-CM

## 2017-06-17 DIAGNOSIS — M542 Cervicalgia: Secondary | ICD-10-CM | POA: Diagnosis not present

## 2017-06-17 DIAGNOSIS — R293 Abnormal posture: Secondary | ICD-10-CM

## 2017-06-17 DIAGNOSIS — M545 Low back pain: Secondary | ICD-10-CM

## 2017-06-17 DIAGNOSIS — M6281 Muscle weakness (generalized): Secondary | ICD-10-CM

## 2017-06-18 ENCOUNTER — Ambulatory Visit
Admission: RE | Admit: 2017-06-18 | Discharge: 2017-06-18 | Disposition: A | Discharge status: Home / Self Care | Payer: Medicaid Other | Source: Ambulatory Visit | Attending: Neurology | Admitting: Neurology

## 2017-06-18 DIAGNOSIS — M542 Cervicalgia: Secondary | ICD-10-CM

## 2017-06-18 MED ORDER — TRIAMCINOLONE ACETONIDE 40 MG/ML IJ SUSP (RADIOLOGY)
60.0000 mg | Freq: Once | INTRAMUSCULAR | Status: AC
  Administered 2017-06-18: 60 mg via EPIDURAL

## 2017-06-18 MED ORDER — IOPAMIDOL (ISOVUE-M 300) INJECTION 61%
1.0000 mL | Freq: Once | INTRAMUSCULAR | Status: AC | PRN
  Administered 2017-06-18: 1 mL via EPIDURAL

## 2017-06-18 NOTE — Discharge Instructions (Signed)

## 2017-06-19 NOTE — Therapy (Addendum)
[facility] [address] [place], [place], [address] Phone: [phone]   Fax:  [phone]  Physical Therapy Treatment  Patient Details  Name: Denise Patrick MRN: [id number] Date of Birth: [birth year] Referring Provider: Margette Fast MD   Encounter Date: 06/17/2017    Treatment 2 of 12.  Recert date: 0/25/85   Past Medical History:  Diagnosis Date  . Anxiety   . Cancer (HCC)    Cervical  . Depression   . Headache   . History of kidney stones   . Migraine   . Renal disorder     Past Surgical History:  Procedure Laterality Date  . KIDNEY STONE SURGERY    . LEEP N/A 11/05/2015   Procedure: LOOP ELECTROSURGICAL EXCISION PROCEDURE (LEEP);  Surgeon: Brayton Mars, MD;  Location: [facility] ORS;  Service: Gynecology;  Laterality: N/A;  . TONSILLECTOMY      There were no vitals filed for this visit.    Pt. states she is scheduled for neck injection tomorrow for pain. Pt. states she is alittle concerned but ready to get rid of pain. Pt. states neck ex. are not helping.       Treatment:   There.ex.: Reviewed HEP Cervical AROM all planes/ UT stretches in seated posture.   Manual tx.: Supine UT/levator/ cervical rotn. Stretches 3x each with static holds.   Supine cervical traction 5x with holds as tolerated.  Pt. Reports feeling a good stretch/ less pain reported.  STM to upper thoracic/ B UT/ cervical musculature 8 min.  Seated thoracic manipulation (J-stroke) 2x with multiple cavitations noted.   Prone Grade III PA mobs. (central) to mid-thoracic region.  No cavitations noted.          Pt. presents with increase UT/ cervical rotn. AROM prior to tx. today with persistent c/o 5/10 neck pain. Moderate upper thoracic/low cervical hypomobility during PA mobs. Several pain limiting trigger points with palpation to B UT region and PT discussed benefits of dry needling. Pt. will hold off on dry needling at this time due  to pt. having spinal injection tomorrow.       PT Long Term Goals - 06/10/17 1826      PT LONG TERM GOAL #1   Title  Pt will improve FOTO to 57 to demonstrate reduced disability.    Baseline  FOTO 44    Time  6    Period  Weeks    Status  New    Target Date  07/22/17      PT LONG TERM GOAL #2   Title  Patient will reduce worst pain to 4/10 for improved concentration with classwork and occupational tasks.    Baseline  Worst pain 8/10    Time  6    Period  Weeks    Status  New    Target Date  07/22/17      PT LONG TERM GOAL #3   Title  Patient will demonstrate full normal cervical ROM to improve daily mobility/ driving.     Baseline  Side-bending limited to 20 deg on L; Rotation limited to 60 deg. B    Time  6    Period  Weeks    Status  New    Target Date  07/22/17      PT LONG TERM GOAL #4   Title  Patient will report no disturbances of vision to demonstrate reduced disability.    Baseline  Patient reports blurry vision with worsening  tension in neck.    Time  6    Period  Weeks    Status  New    Target Date  07/22/17              Patient will benefit from skilled therapeutic intervention in order to improve the following deficits and impairments:  Pain, Impaired perceived functional ability, Impaired flexibility, Impaired tone, Postural dysfunction, Decreased activity tolerance  Visit Diagnosis: Cervicalgia  Abnormal posture  Joint stiffness  Muscle weakness (generalized)  Chronic bilateral low back pain without sciatica     Problem List Patient Active Problem List   Diagnosis Date Noted  . UTI (urinary tract infection) 03/08/2017  . Positive urine pregnancy test 03/04/2017  . 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 Severe dysplasia of cervix (CIN III) 09/27/2015  . Migraine 09/12/2015  . Endometriosis 09/06/2015  . Dysmenorrhea 09/06/2015  . ASCUS with positive high risk HPV 09/06/2015  . Tobacco user 09/06/2015  . Upper back pain 05/01/2015  .  Vision changes 05/01/2015  . Tooth pain 05/01/2015   Pura Spice, PT, DPT # [id number] 06/24/2017, 1:18 PM  [facility] [address] [place], [place], [address] Phone: [phone]   Fax:  [phone]  Name: Denise Patrick MRN: [id number] Date of Birth: 1980/08/15

## 2017-06-24 ENCOUNTER — Ambulatory Visit: Payer: Medicaid Other | Attending: Neurology | Admitting: Physical Therapy

## 2017-06-24 DIAGNOSIS — M542 Cervicalgia: Secondary | ICD-10-CM | POA: Diagnosis not present

## 2017-06-24 DIAGNOSIS — M2560 Stiffness of unspecified joint, not elsewhere classified: Secondary | ICD-10-CM | POA: Diagnosis present

## 2017-06-24 DIAGNOSIS — R293 Abnormal posture: Secondary | ICD-10-CM | POA: Insufficient documentation

## 2017-06-24 DIAGNOSIS — G8929 Other chronic pain: Secondary | ICD-10-CM | POA: Insufficient documentation

## 2017-06-24 DIAGNOSIS — M6281 Muscle weakness (generalized): Secondary | ICD-10-CM | POA: Insufficient documentation

## 2017-06-24 DIAGNOSIS — M545 Low back pain: Secondary | ICD-10-CM | POA: Diagnosis present

## 2017-06-25 ENCOUNTER — Encounter: Payer: Self-pay | Admitting: Physical Therapy

## 2017-06-25 ENCOUNTER — Telehealth: Payer: Self-pay | Admitting: Obstetrics and Gynecology

## 2017-06-25 MED ORDER — DULOXETINE HCL 30 MG PO CPEP: ORAL_CAPSULE | ORAL | 3 refills | Status: DC

## 2017-06-25 NOTE — Addendum Note (Signed)
Addended by: Kathrynn Ducking on: 06/25/2017 05:06 PM   Modules accepted: Orders
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
## 2017-06-25 NOTE — Telephone Encounter (Signed)
Pt called stating she had the steroid injection last week and it has already stopped working. Stating she is currently in a lot of pain please call to advise

## 2017-06-25 NOTE — Telephone Encounter (Signed)
The patient called requesting a refill on her ibuprofen, and a Rx for Flagyl if possible.  She thinks she has BV from all the medications she has been on.  She can be reached at [phone].  Please advise, thank you.

## 2017-06-25 NOTE — Telephone Encounter (Signed)
I called the patient.  The patient got only about a week of benefit from the epidural steroid injection of the neck, we will repeat this again and start her on Cymbalta.  She claims that the Robaxin is not helping at all, we will stop the drug.

## 2017-06-26 MED ORDER — METRONIDAZOLE 500 MG PO TABS: 500.0000 mg | ORAL_TABLET | Freq: Two times a day (BID) | ORAL | 0 refills | Status: DC

## 2017-06-26 MED ORDER — IBUPROFEN 800 MG PO TABS: 800.0000 mg | ORAL_TABLET | Freq: Three times a day (TID) | ORAL | 0 refills | Status: DC | PRN

## 2017-06-26 NOTE — Telephone Encounter (Signed)
Pt states she has a vaginal odor present. She is having a pinkish tinge d/c. Her cycle is suppose to start soon. NO new sex partners. Will erx flagyl. She request ibup also. Pt aware if sx no better after completion of meds. She will need to be seen.

## 2017-06-27 ENCOUNTER — Encounter: Payer: Self-pay | Admitting: Emergency Medicine

## 2017-06-27 ENCOUNTER — Other Ambulatory Visit: Payer: Self-pay

## 2017-06-27 ENCOUNTER — Emergency Department
Admission: EM | Admit: 2017-06-27 | Discharge: 2017-06-27 | Disposition: A | Discharge status: Home / Self Care | Payer: Medicaid Other | Attending: Emergency Medicine | Admitting: Emergency Medicine

## 2017-06-27 DIAGNOSIS — Z8541 Personal history of malignant neoplasm of cervix uteri: Secondary | ICD-10-CM | POA: Diagnosis not present

## 2017-06-27 DIAGNOSIS — R51 Headache: Secondary | ICD-10-CM | POA: Diagnosis present

## 2017-06-27 DIAGNOSIS — Z79899 Other long term (current) drug therapy: Secondary | ICD-10-CM | POA: Insufficient documentation

## 2017-06-27 DIAGNOSIS — Z87891 Personal history of nicotine dependence: Secondary | ICD-10-CM | POA: Insufficient documentation

## 2017-06-27 DIAGNOSIS — G43909 Migraine, unspecified, not intractable, without status migrainosus: Secondary | ICD-10-CM | POA: Diagnosis not present

## 2017-06-27 MED ORDER — HYDROMORPHONE HCL 1 MG/ML IJ SOLN
1.0000 mg | Freq: Once | INTRAMUSCULAR | Status: AC
  Administered 2017-06-27: 1 mg via INTRAMUSCULAR

## 2017-06-27 MED ORDER — CYCLOBENZAPRINE HCL 10 MG PO TABS: 10.0000 mg | ORAL_TABLET | Freq: Three times a day (TID) | ORAL | 0 refills | Status: AC | PRN

## 2017-06-27 MED ORDER — CYCLOBENZAPRINE HCL 10 MG PO TABS
10.0000 mg | ORAL_TABLET | Freq: Once | ORAL | Status: AC
  Administered 2017-06-27: 10 mg via ORAL
  Filled 2017-06-27: qty 1

## 2017-06-27 MED ORDER — PROCHLORPERAZINE EDISYLATE 10 MG/2ML IJ SOLN
INTRAMUSCULAR | Status: AC
  Administered 2017-06-27: 10 mg via INTRAMUSCULAR
  Filled 2017-06-27: qty 2

## 2017-06-27 MED ORDER — HYDROMORPHONE HCL 1 MG/ML IJ SOLN
INTRAMUSCULAR | Status: AC
  Administered 2017-06-27: 1 mg via INTRAMUSCULAR
  Filled 2017-06-27: qty 1

## 2017-06-27 MED ORDER — PROCHLORPERAZINE EDISYLATE 10 MG/2ML IJ SOLN
10.0000 mg | Freq: Once | INTRAMUSCULAR | Status: AC
  Administered 2017-06-27: 10 mg via INTRAMUSCULAR

## 2017-06-27 NOTE — ED Triage Notes (Addendum)
Pt reports migraine since 1030 this; pain to neck, back of her head and behind her eyes; pt says this is her regular migraine;  took Imitrex around 1pm with no relief; pt also gets monthly injections of Aimovig, last injection was 3 days ago; pt reports sensitive to lights; nausea with vomiting;awake and alert; talking in complete coherent sentences

## 2017-06-27 NOTE — ED Notes (Signed)
Patients grandfather here and will be driving patient home.

## 2017-06-27 NOTE — ED Provider Notes (Signed)
[facility] Emergency Department Provider Note  ____________________________________________  Time seen: Approximately 10:40 PM  I have reviewed the triage vital signs and the nursing notes.   HISTORY  Chief Complaint Migraine    HPI Denise Patrick is a 37 y.o. female with a history of anxiety and depression, presents to the emergency department with a 10 out of 10 headache with associated photophobia, phonophobia, nausea and vomiting.  Patient has been seen numerous times at [facility] in the past for migraine.  Patient reports that this is not the worst headache of her life.  Patient reports that headache started at approximately 10:30 AM this morning when she woke up.  Patient reports that she had to get up in the middle of the night for work and did not sleep as well as usual.  Patient reports that she has been under the care of neurology and receives monthly injections of Aimovig, which is the only medication she has found to relieve her chronic migraines.  Patient reports that she has only experienced chronic migraines for the past 6 or so months.  Patient reports that since given chronic migraines, her depression has worsened.  Patient denies being currently suicidal or homicidal.  Patient reports that her migraines are affecting her activities of daily living.  Patient is unsure of what medications have helped her in the past at prior emergency department encounters but reports that "a blonde lady helped me once and was very nice".    Past Medical History:  Diagnosis Date  . Anxiety   . Cancer (HCC)    Cervical  . Depression   . Headache   . History of kidney stones   . Migraine   . Renal disorder     Patient Active Problem List   Diagnosis Date Noted  . UTI (urinary tract infection) 03/08/2017  . Positive urine pregnancy test 03/04/2017  . 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 Severe dysplasia of cervix (CIN III) 09/27/2015  . Migraine 09/12/2015  .  Endometriosis 09/06/2015  . Dysmenorrhea 09/06/2015  . ASCUS with positive high risk HPV 09/06/2015  . Tobacco user 09/06/2015  . Upper back pain 05/01/2015  . Vision changes 05/01/2015  . Tooth pain 05/01/2015    Past Surgical History:  Procedure Laterality Date  . KIDNEY STONE SURGERY    . LEEP N/A 11/05/2015   Procedure: LOOP ELECTROSURGICAL EXCISION PROCEDURE (LEEP);  Surgeon: Brayton Mars, MD;  Location: [facility] ORS;  Service: Gynecology;  Laterality: N/A;  . TONSILLECTOMY      Prior to Admission medications   Medication Sig Start Date End Date Taking? Authorizing Provider  Erenumab-aooe (AIMOVIG 140 DOSE) 70 MG/ML SOAJ Inject 140 mg into the skin every 30 (thirty) days. 04/20/17  Yes Kathrynn Ducking, MD  ibuprofen (ADVIL,MOTRIN) 800 MG tablet Take 1 tablet (800 mg total) by mouth every 8 (eight) hours as needed. 06/26/17  Yes Harlin Heys, MD  metoCLOPramide (REGLAN) 10 MG tablet Take 1 tablet (10 mg total) by mouth 4 (four) times daily. 03/04/17  Yes Defrancesco, Alanda Slim, MD  metroNIDAZOLE (FLAGYL) 500 MG tablet Take 1 tablet (500 mg total) by mouth 2 (two) times daily. 06/26/17  Yes Harlin Heys, MD  ondansetron (ZOFRAN ODT) 4 MG disintegrating tablet Take 1 tablet (4 mg total) by mouth every 8 (eight) hours as needed for nausea or vomiting. 04/09/17  Yes Earleen Newport, MD  SUMAtriptan (IMITREX) 100 MG tablet Take 100 mg by mouth every 2 (two) hours  as needed for migraine. May repeat in 2 hours if headache persists or recurs.   Yes [provider]  cyclobenzaprine (FLEXERIL) 10 MG tablet Take 1 tablet (10 mg total) by mouth 3 (three) times daily as needed for up to 5 days. 06/27/17 07/02/17  Lannie Fields, PA-C    Allergies Prednisone  Family History  Problem Relation Age of Onset  . Hypertension Father   . Cancer Father   . Cancer Maternal Grandmother   . COPD Maternal Grandmother   . Migraines Mother     Social History Social History    Tobacco Use  . Smoking status: Former Smoker    Packs/day: 0.00    Years: 0.00    Pack years: 0.00  . Smokeless tobacco: Never Used  Substance Use Topics  . Alcohol use: No  . Drug use: No     Review of Systems  Constitutional: No fever/chills Eyes: No visual changes. No discharge ENT: No upper respiratory complaints. Cardiovascular: no chest pain. Respiratory: no cough. No SOB. Gastrointestinal: No abdominal pain.  No nausea, no vomiting.  No diarrhea.  No constipation. Musculoskeletal: Negative for musculoskeletal pain. Skin: Negative for rash, abrasions, lacerations, ecchymosis. Neurological: Patient has headache, no focal weakness or numbness.   ____________________________________________   PHYSICAL EXAM:  VITAL SIGNS: ED Triage Vitals  Enc Vitals Group     BP 06/27/17 2148 109/76     Pulse Rate 06/27/17 2148 66     Resp 06/27/17 2148 18     Temp 06/27/17 2148 98.8 F (37.1 C)     Temp Source 06/27/17 2148 Oral     SpO2 06/27/17 2148 98 %     Weight 06/27/17 2149 203 lb (92.1 kg)     Height 06/27/17 2149 5\' 6"  (1.676 m)     Head Circumference --      Peak Flow --      Pain Score 06/27/17 2148 9     Pain Loc --      Pain Edu? --      Excl. in [place]? --      Constitutional: Alert and oriented. Well appearing and in no acute distress. Eyes: Conjunctivae are normal. PERRL. EOMI. Head: Atraumatic. ENT:      Ears: TMs are pearly.      Nose: No congestion/rhinnorhea.      Mouth/Throat: Mucous membranes are moist.  Neck: No stridor.  No cervical spine tenderness to palpation. Cardiovascular: Normal rate, regular rhythm. Normal S1 and S2.  Good peripheral circulation. Respiratory: Normal respiratory effort without tachypnea or retractions. Lungs CTAB. Good air entry to the bases with no decreased or absent breath sounds. Gastrointestinal: Bowel sounds 4 quadrants. Soft and nontender to palpation. No guarding or rigidity. No palpable masses. No distention. No  CVA tenderness. Musculoskeletal: Full range of motion to all extremities. No gross deformities appreciated. Neurologic:  Normal speech and language. No gross focal neurologic deficits are appreciated.  Skin:  Skin is warm, dry and intact. No rash noted.  ____________________________________________   LABS (all labs ordered are listed, but only abnormal results are displayed)  Labs Reviewed - No data to display ____________________________________________  EKG   ____________________________________________  RADIOLOGY   No results found.  ____________________________________________    PROCEDURES  Procedure(s) performed:    Procedures    Medications  HYDROmorphone (DILAUDID) injection 1 mg (1 mg Intramuscular Given 06/27/17 2220)  prochlorperazine (COMPAZINE) injection 10 mg (10 mg Intramuscular Given 06/27/17 2220)  cyclobenzaprine (FLEXERIL) tablet 10 mg (10  mg Oral Given 06/27/17 2253)     ____________________________________________   INITIAL IMPRESSION / ASSESSMENT AND PLAN / ED COURSE  Pertinent labs & imaging results that were available during my care of the patient were reviewed by me and considered in my medical decision making (see chart for details).  Review of the [place] CSRS was performed in accordance of the [place] prior to dispensing any controlled drugs.      Assessment and plan Migraine Patient presents to the emergency department with acute onset of a migraine that occurred at 10:30 AM today since neurologic exam was completely reassuring in the emergency department.  She received injections of Dilaudid and oral Flexeril and reported that her migraine improved by more than 60%.  Patient reports "I am ready to go".  Vital signs are reassuring prior to discharge.  Patient requested to be referred to another neurologist and request was granted.  Patient education regarding conservative practices such as yoga, were recommended to patient for migraine  prophylaxis.    ____________________________________________  FINAL CLINICAL IMPRESSION(S) / ED DIAGNOSES  Final diagnoses:  Migraine without status migrainosus, not intractable, unspecified migraine type      NEW MEDICATIONS STARTED DURING THIS VISIT:  ED Discharge Orders        Ordered    cyclobenzaprine (FLEXERIL) 10 MG tablet  3 times daily PRN     06/27/17 2259          This chart was dictated using voice recognition software/Dragon. Despite best efforts to proofread, errors can occur which can change the meaning. Any change was purely unintentional.    Karren Cobble 06/27/17 2303    Carrie Mew, MD 06/28/17 [place]

## 2017-07-02 ENCOUNTER — Ambulatory Visit: Payer: Medicaid Other | Admitting: Physical Therapy

## 2017-07-08 ENCOUNTER — Telehealth: Payer: Self-pay | Admitting: Neurology

## 2017-07-08 DIAGNOSIS — M47812 Spondylosis without myelopathy or radiculopathy, cervical region: Secondary | ICD-10-CM

## 2017-07-08 MED ORDER — BACLOFEN 10 MG PO TABS: 5.0000 mg | ORAL_TABLET | Freq: Three times a day (TID) | ORAL | 3 refills | Status: AC

## 2017-07-08 NOTE — Telephone Encounter (Signed)
Pt requesting a call from Dr. Jannifer Franklin stating she hasnt heard anything back regarding the epidural shot he wanted her to get. Also stating she is need a "heavy heavy muscle relaxer to maintain the pain". Please call to advise

## 2017-07-08 NOTE — Telephone Encounter (Signed)
I called the patient.  The patient has not heard anything yet about the epidural steroid injection, I will put the order back in regarding this.  The patient has been on Flexeril and tizanidine, she is not sure that these medications were helpful, she claims that the [place] has markedly improved her migraines, she is only having 2 a month.  I will start baclofen 5 mg 3 times a day for her muscle spasms.

## 2017-07-09 ENCOUNTER — Other Ambulatory Visit: Payer: Self-pay | Admitting: Neurology

## 2017-07-09 DIAGNOSIS — M47812 Spondylosis without myelopathy or radiculopathy, cervical region: Secondary | ICD-10-CM

## 2017-07-09 NOTE — Telephone Encounter (Signed)
Denise Patrick (AIMOVIG 140 DOSE) 21 MG/ML SOAJ   Dr. Jannifer Franklin she this this is causing her neck pain . Patient is having very bad neck pain she can't sleep or eat.  Angelita Ingles from [place] has been out of the office sick she will get patient called today or tomorrow  with her injection apt.

## 2017-07-16 ENCOUNTER — Encounter: Payer: Self-pay | Admitting: Physical Therapy

## 2017-07-16 ENCOUNTER — Ambulatory Visit: Payer: Medicaid Other | Admitting: Physical Therapy
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
## 2017-07-17 ENCOUNTER — Encounter: Payer: Self-pay | Admitting: Physical Therapy

## 2017-07-21 ENCOUNTER — Ambulatory Visit
Admission: RE | Admit: 2017-07-21 | Discharge: 2017-07-21 | Disposition: A | Discharge status: Home / Self Care | Payer: Medicaid Other | Source: Ambulatory Visit | Attending: Neurology | Admitting: Neurology

## 2017-07-21 DIAGNOSIS — M47812 Spondylosis without myelopathy or radiculopathy, cervical region: Secondary | ICD-10-CM

## 2017-07-21 MED ORDER — IOPAMIDOL (ISOVUE-M 300) INJECTION 61%
1.0000 mL | Freq: Once | INTRAMUSCULAR | Status: AC | PRN
  Administered 2017-07-21: 1 mL via EPIDURAL

## 2017-07-21 MED ORDER — TRIAMCINOLONE ACETONIDE 40 MG/ML IJ SUSP (RADIOLOGY)
60.0000 mg | Freq: Once | INTRAMUSCULAR | Status: AC
  Administered 2017-07-21: 60 mg via EPIDURAL

## 2017-07-27 NOTE — Progress Notes (Deleted)
[facility]  PATIENT: Denise Patrick DOB: June 28, 1980   REASON FOR VISIT: Follow-up for intractable migraine HISTORY FROM:    HISTORY OF PRESENT ILLNESS: 4/1/19KWMs. Patrick is a 37 year old right-handed white female with a history of occasional headaches that have occurred during pregnancy only.  The patient indicates that her mother has migraine headaches but she never really had significant headaches when she was growing up.  The patient slipped while going down a ramp in November 2018 and bumped the back of her head.  The patient did not lose consciousness.  She began having some headaches following this accident, she initially was having about 2 headaches a week on average.  The headaches were coming up from the back of the neck and head, going down between the shoulder blades as well with neck stiffness.  The patient has had some worsening of the headache severity that has occurred in February 2019.  The patient is having over 15 headache days a month.  The patient has had some problems with gaining weight since the onset of the headaches.  Prior to bumping her head, the patient had been on amitriptyline taking 20 mg at night for depression problems.  The patient now is on nortriptyline only taking 10 mg at night.  She indicates that before the headache starts she may feel spacey.  The headache may last up to 3 days, and is mainly centered in the back of the head.  The patient may have nausea and vomiting, she may see spots in front of the eyes.  Bright lights and loud noises may bother her, odors are quite bothersome during the headaches as well.  The patient has intermittent numbness of the hands and forearms, she feels weak all over.  She may have some problems with incontinence of the bladder that is a chronic issue for her.  She has been tried on Topamax but claims that this did not help.  She has been taking Toradol or ibuprofen or Fioricet for the headaches.  She does not  drink a lot of caffeinated products.  She has taken Imitrex as well for the headache.  She has gone to the emergency room relatively frequently for treatment of her headache.  She is sent to this office for an evaluation.  CT scan of the brain has been done recently and has been unremarkable.    REVIEW OF SYSTEMS: Full 14 system review of systems performed and notable only for those listed, all others are neg:  Constitutional: neg  Cardiovascular: neg Ear/Nose/Throat: neg  Skin: neg Eyes: neg Respiratory: neg Gastroitestinal: neg  Hematology/Lymphatic: neg  Endocrine: neg Musculoskeletal:neg Allergy/Immunology: neg Neurological: neg Psychiatric: neg Sleep : neg   ALLERGIES: Allergies  Allergen Reactions  . Prednisone     Emotional instability    HOME MEDICATIONS: Outpatient Medications Prior to Visit  Medication Sig Dispense Refill  . baclofen (LIORESAL) 10 MG tablet Take 0.5 tablets (5 mg total) by mouth 3 (three) times daily. 45 each 3  . Erenumab-aooe (AIMOVIG 140 DOSE) 70 MG/ML SOAJ Inject 140 mg into the skin every 30 (thirty) days. 2 pen 4  . ibuprofen (ADVIL,MOTRIN) 800 MG tablet Take 1 tablet (800 mg total) by mouth every 8 (eight) hours as needed. 90 tablet 0  . metoCLOPramide (REGLAN) 10 MG tablet Take 1 tablet (10 mg total) by mouth 4 (four) times daily. 40 tablet 1  . metroNIDAZOLE (FLAGYL) 500 MG tablet Take 1 tablet (500 mg total) by mouth 2 (two)  times daily. 14 tablet 0  . ondansetron (ZOFRAN ODT) 4 MG disintegrating tablet Take 1 tablet (4 mg total) by mouth every 8 (eight) hours as needed for nausea or vomiting. 20 tablet 0  . SUMAtriptan (IMITREX) 100 MG tablet Take 100 mg by mouth every 2 (two) hours as needed for migraine. May repeat in 2 hours if headache persists or recurs.     No facility-administered medications prior to visit.     PAST MEDICAL HISTORY: Past Medical History:  Diagnosis Date  . Anxiety   . Cancer (HCC)    Cervical  . Depression    . Headache   . History of kidney stones   . Migraine   . Renal disorder     PAST SURGICAL HISTORY: Past Surgical History:  Procedure Laterality Date  . KIDNEY STONE SURGERY    . LEEP N/A 11/05/2015   Procedure: LOOP ELECTROSURGICAL EXCISION PROCEDURE (LEEP);  Surgeon: Brayton Mars, MD;  Location: [facility] ORS;  Service: Gynecology;  Laterality: N/A;  . TONSILLECTOMY      FAMILY HISTORY: Family History  Problem Relation Age of Onset  . Hypertension Father   . Cancer Father   . Cancer Maternal Grandmother   . COPD Maternal Grandmother   . Migraines Mother     SOCIAL HISTORY: Social History   Socioeconomic History  . Marital status: Single    Spouse name: Not on file  . Number of children: 2  . Years of education: college  . Highest education level: Not on file  Occupational History  . Occupation: [employer]  Social Needs  . Financial resource strain: Not on file  . Food insecurity:    Worry: Not on file    Inability: Not on file  . Transportation needs:    Medical: Not on file    Non-medical: Not on file  Tobacco Use  . Smoking status: Former Smoker    Packs/day: 0.00    Years: 0.00    Pack years: 0.00  . Smokeless tobacco: Never Used  Substance and Sexual Activity  . Alcohol use: No  . Drug use: No  . Sexual activity: Yes    Birth control/protection: None  Lifestyle  . Physical activity:    Days per week: Not on file    Minutes per session: Not on file  . Stress: Not on file  Relationships  . Social connections:    Talks on phone: Not on file    Gets together: Not on file    Attends religious service: Not on file    Active member of club or organization: Not on file    Attends meetings of clubs or organizations: Not on file    Relationship status: Not on file  . Intimate partner violence:    Fear of current or ex partner: Not on file    Emotionally abused: Not on file    Physically abused: Not on file    Forced sexual activity:  Not on file  Other Topics Concern  . Not on file  Social History Narrative   Lives w/    Caffeine use: sometimes    Right handed      PHYSICAL EXAM  There were no vitals filed for this visit. There is no height or weight on file to calculate BMI.  Generalized: Well developed, in no acute distress  Head: normocephalic and atraumatic,. Oropharynx benign  Neck: Supple, no carotid bruits  Cardiac: Regular rate rhythm, no murmur  Musculoskeletal: No deformity  Neurological examination   Mentation: Alert oriented to time, place, history taking. Attention span and concentration appropriate. Recent and remote memory intact.  Follows all commands speech and language fluent.   Cranial nerve II-XII: Fundoscopic exam reveals sharp disc margins.Pupils were equal round reactive to light extraocular movements were full, visual field were full on confrontational test. Facial sensation and strength were normal. hearing was intact to finger rubbing bilaterally. Uvula tongue midline. head turning and shoulder shrug were normal and symmetric.Tongue protrusion into cheek strength was normal. Motor: normal bulk and tone, full strength in the BUE, BLE, fine finger movements normal, no pronator drift. No focal weakness Sensory: normal and symmetric to light touch, pinprick, and  Vibration, proprioception  Coordination: finger-nose-finger, heel-to-shin bilaterally, no dysmetria Reflexes: Brachioradialis 2/2, biceps 2/2, triceps 2/2, patellar 2/2, Achilles 2/2, plantar responses were flexor bilaterally. Gait and Station: Rising up from seated position without assistance, normal stance,  moderate stride, good arm swing, smooth turning, able to perform tiptoe, and heel walking without difficulty. Tandem gait is steady  DIAGNOSTIC DATA (LABS, IMAGING, TESTING) - I reviewed patient records, labs, notes, testing and imaging myself where available.  Lab Results  Component Value Date   WBC 9.4 03/19/2017   HGB  12.9 03/19/2017   HCT 38.4 03/19/2017   MCV 89.4 03/19/2017   PLT 461 (H) 03/19/2017      Component Value Date/Time   NA 139 03/19/2017 2140   NA 140 09/12/2015 1035   K 3.9 03/19/2017 2140   CL 110 03/19/2017 2140   CO2 20 (L) 03/19/2017 2140   GLUCOSE 145 (H) 03/19/2017 2140   BUN 21 (H) 03/19/2017 2140   BUN 18 09/12/2015 1035   CREATININE 0.93 03/19/2017 2140   CALCIUM 9.0 03/19/2017 2140   PROT 7.4 08/01/2016 1623   PROT 6.8 09/12/2015 1035   ALBUMIN 4.3 08/01/2016 1623   ALBUMIN 4.3 09/12/2015 1035   AST 20 08/01/2016 1623   ALT 18 08/01/2016 1623   ALKPHOS 66 08/01/2016 1623   BILITOT 0.4 08/01/2016 1623   BILITOT 0.3 09/12/2015 1035   GFRNONAA >60 03/19/2017 2140   GFRAA >60 03/19/2017 2140   Lab Results  Component Value Date   CHOL 159 09/12/2015   HDL 48 09/12/2015   LDLCALC 86 09/12/2015   TRIG 126 09/12/2015   CHOLHDL 3.3 09/12/2015   No results found for: HGBA1C No results found for: VITAMINB12 Lab Results  Component Value Date   TSH 0.670 08/01/2016      ASSESSMENT AND PLAN  37 y.o. year old female  has a past medical history of Anxiety, Cancer ([place]), Depression, Headache, History of kidney stones, Migraine, and Renal disorder. here with ***  Trauma trigger migraine  The patient appears to have migraine headaches that were initiated from a minor head injury in November 2018.  The patient may have some muscle tension component to the headache as well at this point.  The patient is on nortriptyline which should be a good medication for her, we will increase the medication to 20 mg at night, if she is willing to go higher on the dose she will contact me.  We will try to get her started on Aimovig.  The patient may go on magnesium supplementation and begin taking riboflavin 400 mg daily.  She will follow-up in 3 months.  Currently, the patient is a [employer] and she indicates that she is not performing well in class.    Dennie Bible, GNP, [facility],  APRN  [facility] [address], [address] [place], [place] [address] [phone] 

## 2017-07-28 ENCOUNTER — Telehealth: Payer: Self-pay | Admitting: *Deleted

## 2017-07-28 ENCOUNTER — Ambulatory Visit: Payer: Medicaid Other | Admitting: Nurse Practitioner

## 2017-07-28 NOTE — Telephone Encounter (Signed)
Patient was no show for follow up with NP today.  

## 2017-07-28 NOTE — Progress Notes (Deleted)
[facility]  PATIENT: Denise Patrick DOB: 12-01-1980   REASON FOR VISIT: Follow-up for intractable migraine HISTORY FROM:    HISTORY OF PRESENT ILLNESS: 4/1/19KWMs. Defibaugh is a 37 year old right-handed white female with a history of occasional headaches that have occurred during pregnancy only.  The patient indicates that her mother has migraine headaches but she never really had significant headaches when she was growing up.  The patient slipped while going down a ramp in November 2018 and bumped the back of her head.  The patient did not lose consciousness.  She began having some headaches following this accident, she initially was having about 2 headaches a week on average.  The headaches were coming up from the back of the neck and head, going down between the shoulder blades as well with neck stiffness.  The patient has had some worsening of the headache severity that has occurred in February 2019.  The patient is having over 15 headache days a month.  The patient has had some problems with gaining weight since the onset of the headaches.  Prior to bumping her head, the patient had been on amitriptyline taking 20 mg at night for depression problems.  The patient now is on nortriptyline only taking 10 mg at night.  She indicates that before the headache starts she may feel spacey.  The headache may last up to 3 days, and is mainly centered in the back of the head.  The patient may have nausea and vomiting, she may see spots in front of the eyes.  Bright lights and loud noises may bother her, odors are quite bothersome during the headaches as well.  The patient has intermittent numbness of the hands and forearms, she feels weak all over.  She may have some problems with incontinence of the bladder that is a chronic issue for her.  She has been tried on Topamax but claims that this did not help.  She has been taking Toradol or ibuprofen or Fioricet for the headaches.  She does not  drink a lot of caffeinated products.  She has taken Imitrex as well for the headache.  She has gone to the emergency room relatively frequently for treatment of her headache.  She is sent to this office for an evaluation.  CT scan of the brain has been done recently and has been unremarkable.    REVIEW OF SYSTEMS: Full 14 system review of systems performed and notable only for those listed, all others are neg:  Constitutional: neg  Cardiovascular: neg Ear/Nose/Throat: neg  Skin: neg Eyes: neg Respiratory: neg Gastroitestinal: neg  Hematology/Lymphatic: neg  Endocrine: neg Musculoskeletal:neg Allergy/Immunology: neg Neurological: neg Psychiatric: neg Sleep : neg   ALLERGIES: Allergies  Allergen Reactions  . Prednisone     Emotional instability    HOME MEDICATIONS: Outpatient Medications Prior to Visit  Medication Sig Dispense Refill  . baclofen (LIORESAL) 10 MG tablet Take 0.5 tablets (5 mg total) by mouth 3 (three) times daily. 45 each 3  . Erenumab-aooe (AIMOVIG 140 DOSE) 70 MG/ML SOAJ Inject 140 mg into the skin every 30 (thirty) days. 2 pen 4  . ibuprofen (ADVIL,MOTRIN) 800 MG tablet Take 1 tablet (800 mg total) by mouth every 8 (eight) hours as needed. 90 tablet 0  . metoCLOPramide (REGLAN) 10 MG tablet Take 1 tablet (10 mg total) by mouth 4 (four) times daily. 40 tablet 1  . metroNIDAZOLE (FLAGYL) 500 MG tablet Take 1 tablet (500 mg total) by mouth 2 (two)  times daily. 14 tablet 0  . ondansetron (ZOFRAN ODT) 4 MG disintegrating tablet Take 1 tablet (4 mg total) by mouth every 8 (eight) hours as needed for nausea or vomiting. 20 tablet 0  . SUMAtriptan (IMITREX) 100 MG tablet Take 100 mg by mouth every 2 (two) hours as needed for migraine. May repeat in 2 hours if headache persists or recurs.     No facility-administered medications prior to visit.     PAST MEDICAL HISTORY: Past Medical History:  Diagnosis Date  . Anxiety   . Cancer (HCC)    Cervical  . Depression    . Headache   . History of kidney stones   . Migraine   . Renal disorder     PAST SURGICAL HISTORY: Past Surgical History:  Procedure Laterality Date  . KIDNEY STONE SURGERY    . LEEP N/A 11/05/2015   Procedure: LOOP ELECTROSURGICAL EXCISION PROCEDURE (LEEP);  Surgeon: Brayton Mars, MD;  Location: [facility] ORS;  Service: Gynecology;  Laterality: N/A;  . TONSILLECTOMY      FAMILY HISTORY: Family History  Problem Relation Age of Onset  . Hypertension Father   . Cancer Father   . Cancer Maternal Grandmother   . COPD Maternal Grandmother   . Migraines Mother     SOCIAL HISTORY: Social History   Socioeconomic History  . Marital status: Single    Spouse name: Not on file  . Number of children: 2  . Years of education: college  . Highest education level: Not on file  Occupational History  . Occupation: [employer]  Social Needs  . Financial resource strain: Not on file  . Food insecurity:    Worry: Not on file    Inability: Not on file  . Transportation needs:    Medical: Not on file    Non-medical: Not on file  Tobacco Use  . Smoking status: Former Smoker    Packs/day: 0.00    Years: 0.00    Pack years: 0.00  . Smokeless tobacco: Never Used  Substance and Sexual Activity  . Alcohol use: No  . Drug use: No  . Sexual activity: Yes    Birth control/protection: None  Lifestyle  . Physical activity:    Days per week: Not on file    Minutes per session: Not on file  . Stress: Not on file  Relationships  . Social connections:    Talks on phone: Not on file    Gets together: Not on file    Attends religious service: Not on file    Active member of club or organization: Not on file    Attends meetings of clubs or organizations: Not on file    Relationship status: Not on file  . Intimate partner violence:    Fear of current or ex partner: Not on file    Emotionally abused: Not on file    Physically abused: Not on file    Forced sexual activity:  Not on file  Other Topics Concern  . Not on file  Social History Narrative   Lives w/    Caffeine use: sometimes    Right handed      PHYSICAL EXAM  There were no vitals filed for this visit. There is no height or weight on file to calculate BMI.  Generalized: Well developed, in no acute distress  Head: normocephalic and atraumatic,. Oropharynx benign  Neck: Supple, no carotid bruits  Cardiac: Regular rate rhythm, no murmur  Musculoskeletal: No deformity  Neurological examination   Mentation: Alert oriented to time, place, history taking. Attention span and concentration appropriate. Recent and remote memory intact.  Follows all commands speech and language fluent.   Cranial nerve II-XII: Fundoscopic exam reveals sharp disc margins.Pupils were equal round reactive to light extraocular movements were full, visual field were full on confrontational test. Facial sensation and strength were normal. hearing was intact to finger rubbing bilaterally. Uvula tongue midline. head turning and shoulder shrug were normal and symmetric.Tongue protrusion into cheek strength was normal. Motor: normal bulk and tone, full strength in the BUE, BLE, fine finger movements normal, no pronator drift. No focal weakness Sensory: normal and symmetric to light touch, pinprick, and  Vibration, proprioception  Coordination: finger-nose-finger, heel-to-shin bilaterally, no dysmetria Reflexes: Brachioradialis 2/2, biceps 2/2, triceps 2/2, patellar 2/2, Achilles 2/2, plantar responses were flexor bilaterally. Gait and Station: Rising up from seated position without assistance, normal stance,  moderate stride, good arm swing, smooth turning, able to perform tiptoe, and heel walking without difficulty. Tandem gait is steady  DIAGNOSTIC DATA (LABS, IMAGING, TESTING) - I reviewed patient records, labs, notes, testing and imaging myself where available.  Lab Results  Component Value Date   WBC 9.4 03/19/2017   HGB  12.9 03/19/2017   HCT 38.4 03/19/2017   MCV 89.4 03/19/2017   PLT 461 (H) 03/19/2017      Component Value Date/Time   NA 139 03/19/2017 2140   NA 140 09/12/2015 1035   K 3.9 03/19/2017 2140   CL 110 03/19/2017 2140   CO2 20 (L) 03/19/2017 2140   GLUCOSE 145 (H) 03/19/2017 2140   BUN 21 (H) 03/19/2017 2140   BUN 18 09/12/2015 1035   CREATININE 0.93 03/19/2017 2140   CALCIUM 9.0 03/19/2017 2140   PROT 7.4 08/01/2016 1623   PROT 6.8 09/12/2015 1035   ALBUMIN 4.3 08/01/2016 1623   ALBUMIN 4.3 09/12/2015 1035   AST 20 08/01/2016 1623   ALT 18 08/01/2016 1623   ALKPHOS 66 08/01/2016 1623   BILITOT 0.4 08/01/2016 1623   BILITOT 0.3 09/12/2015 1035   GFRNONAA >60 03/19/2017 2140   GFRAA >60 03/19/2017 2140   Lab Results  Component Value Date   CHOL 159 09/12/2015   HDL 48 09/12/2015   LDLCALC 86 09/12/2015   TRIG 126 09/12/2015   CHOLHDL 3.3 09/12/2015   No results found for: HGBA1C No results found for: VITAMINB12 Lab Results  Component Value Date   TSH 0.670 08/01/2016      ASSESSMENT AND PLAN  37 y.o. year old female  has a past medical history of Anxiety, Cancer ([place]), Depression, Headache, History of kidney stones, Migraine, and Renal disorder. here with ***  Trauma trigger migraine  The patient appears to have migraine headaches that were initiated from a minor head injury in November 2018.  The patient may have some muscle tension component to the headache as well at this point.  The patient is on nortriptyline which should be a good medication for her, we will increase the medication to 20 mg at night, if she is willing to go higher on the dose she will contact me.  We will try to get her started on Aimovig.  The patient may go on magnesium supplementation and begin taking riboflavin 400 mg daily.  She will follow-up in 3 months.  Currently, the patient is a [employer] and she indicates that she is not performing well in class.    Dennie Bible, GNP, [facility],  APRN  [facility] [address], [address] [place], [place] [address] [phone] 

## 2017-07-29 ENCOUNTER — Ambulatory Visit: Payer: Medicaid Other | Admitting: Nurse Practitioner

## 2017-07-29 ENCOUNTER — Encounter: Payer: Self-pay | Admitting: Nurse Practitioner

## 2017-07-29 ENCOUNTER — Telehealth: Payer: Self-pay | Admitting: Adult Health

## 2017-07-29 NOTE — Telephone Encounter (Signed)
Patient came in today for an office visit.  She was 6 minutes late.  She was 32 minutes late for her appointment yesterday.  She was advised that she should arrive for her appointment today at 845.  The patient became upset that she would not be seen today.  I did explain to her our office policy.  She stated that she would like to be referred to another office.  I advised that she could consult with her primary care and they could refer her to another neurologist if she desires.  She asked if we would refill her medication.  I advised that we would give her a 30-day refill and she would either need to follow-up with a new neurologist or reschedule her appointment at our office.

## 2017-07-29 NOTE — Telephone Encounter (Signed)
Late entry: the patient came into office yesterday at 2:17 pm. Her appointment time yesterday was 1:45 pm. She was rescheduled for today @ 8:45am , advised to arrive today at 8:15 am to check in. She arrived today at 8:51 am.

## 2017-07-30 ENCOUNTER — Encounter: Payer: Medicaid Other | Admitting: Physical Therapy

## 2017-07-30 NOTE — Telephone Encounter (Signed)
Checked pt meds that we prescribe. Appears she should have refills at pharmacy.

## 2017-08-05 ENCOUNTER — Ambulatory Visit: Payer: Medicaid Other | Attending: Neurology | Admitting: Physical Therapy

## 2017-08-12 ENCOUNTER — Ambulatory Visit: Payer: Medicaid Other | Admitting: Physical Therapy

## 2017-08-19 ENCOUNTER — Ambulatory Visit: Payer: Medicaid Other | Admitting: Physical Therapy

## 2017-08-19 ENCOUNTER — Emergency Department
Admission: EM | Admit: 2017-08-19 | Discharge: 2017-08-19 | Disposition: A | Discharge status: Home / Self Care | Payer: Medicaid Other | Attending: Emergency Medicine | Admitting: Emergency Medicine

## 2017-08-19 ENCOUNTER — Other Ambulatory Visit: Payer: Self-pay

## 2017-08-19 DIAGNOSIS — R448 Other symptoms and signs involving general sensations and perceptions: Secondary | ICD-10-CM | POA: Diagnosis not present

## 2017-08-19 DIAGNOSIS — Z79899 Other long term (current) drug therapy: Secondary | ICD-10-CM | POA: Diagnosis not present

## 2017-08-19 DIAGNOSIS — Z8541 Personal history of malignant neoplasm of cervix uteri: Secondary | ICD-10-CM | POA: Diagnosis not present

## 2017-08-19 DIAGNOSIS — R509 Fever, unspecified: Secondary | ICD-10-CM | POA: Diagnosis not present

## 2017-08-19 DIAGNOSIS — Z87891 Personal history of nicotine dependence: Secondary | ICD-10-CM | POA: Insufficient documentation

## 2017-08-19 DIAGNOSIS — H53149 Visual discomfort, unspecified: Secondary | ICD-10-CM | POA: Insufficient documentation

## 2017-08-19 DIAGNOSIS — R51 Headache: Secondary | ICD-10-CM | POA: Diagnosis present

## 2017-08-19 DIAGNOSIS — G43001 Migraine without aura, not intractable, with status migrainosus: Secondary | ICD-10-CM | POA: Insufficient documentation

## 2017-08-19 MED ORDER — SODIUM CHLORIDE 0.9 % IV BOLUS
1000.0000 mL | Freq: Once | INTRAVENOUS | Status: AC
  Administered 2017-08-19: 1000 mL via INTRAVENOUS

## 2017-08-19 MED ORDER — PROCHLORPERAZINE EDISYLATE 10 MG/2ML IJ SOLN
10.0000 mg | Freq: Once | INTRAMUSCULAR | Status: AC
  Administered 2017-08-19: 10 mg via INTRAVENOUS
  Filled 2017-08-19: qty 2

## 2017-08-19 MED ORDER — HYDROMORPHONE HCL 1 MG/ML IJ SOLN
INTRAMUSCULAR | Status: AC
  Filled 2017-08-19: qty 1

## 2017-08-19 MED ORDER — DIPHENHYDRAMINE HCL 50 MG/ML IJ SOLN
12.5000 mg | Freq: Once | INTRAMUSCULAR | Status: DC
  Filled 2017-08-19: qty 1

## 2017-08-19 MED ORDER — HYDROMORPHONE HCL 1 MG/ML IJ SOLN
1.0000 mg | INTRAMUSCULAR | Status: AC
  Administered 2017-08-19: 1 mg via INTRAVENOUS

## 2017-08-19 NOTE — ED Notes (Signed)
Headache, photosensitivity, noise sensitivity, nausea, emesis that began yesterday. Took at home imitrex last night with no relief.

## 2017-08-19 NOTE — ED Provider Notes (Signed)
[facility] Emergency Department Provider Note   ____________________________________________   First MD Initiated Contact with Patient 08/19/17 [phone]     (approximate)  I have reviewed the triage vital signs and the nursing notes.   HISTORY  Chief Complaint Migraine    HPI Denise Patrick is a 37 y.o. female with history of bad migraines.  She has not had one for 2 months however but developed her usual migraine yesterday she is taken her sumatriptan without relief and came into the emergency room she cannot do anything else.  The headache started in her neck and radiated up into the head again her usual bad migraine.  She does have a low-grade fever today but has no neck stiffness or any other complaints.  Leak is severe and diffuse somewhat throbbing.  Loud noises make it worse as to bright lights.  Again it is her usual bad migraine.  Past Medical History:  Diagnosis Date  . Anxiety   . Cancer (HCC)    Cervical  . Depression   . Headache   . History of kidney stones   . Migraine   . Renal disorder     Patient Active Problem List   Diagnosis Date Noted  . UTI (urinary tract infection) 03/08/2017  . Positive urine pregnancy test 03/04/2017  . 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 Severe dysplasia of cervix (CIN III) 09/27/2015  . Migraine 09/12/2015  . Endometriosis 09/06/2015  . Dysmenorrhea 09/06/2015  . ASCUS with positive high risk HPV 09/06/2015  . Tobacco user 09/06/2015  . Upper back pain 05/01/2015  . Vision changes 05/01/2015  . Tooth pain 05/01/2015    Past Surgical History:  Procedure Laterality Date  . KIDNEY STONE SURGERY    . LEEP N/A 11/05/2015   Procedure: LOOP ELECTROSURGICAL EXCISION PROCEDURE (LEEP);  Surgeon: Brayton Mars, MD;  Location: [facility] ORS;  Service: Gynecology;  Laterality: N/A;  . LITHOTRIPSY    . TONSILLECTOMY      Prior to Admission medications   Medication Sig Start Date End Date Taking?  Authorizing Provider  doxepin (SINEQUAN) 25 MG capsule Take 25 mg by mouth at bedtime as needed (sleep).   Yes [provider]  FLUoxetine (PROZAC) 20 MG capsule Take 10 mg by mouth daily.  08/02/17  Yes [provider]  ibuprofen (ADVIL,MOTRIN) 800 MG tablet Take 1 tablet (800 mg total) by mouth every 8 (eight) hours as needed. 06/26/17  Yes Harlin Heys, MD  metoCLOPramide (REGLAN) 10 MG tablet Take 1 tablet (10 mg total) by mouth 4 (four) times daily. 03/04/17  Yes Defrancesco, Alanda Slim, MD  SUMAtriptan (IMITREX) 100 MG tablet Take 100 mg by mouth every 2 (two) hours as needed for migraine. May repeat in 2 hours if headache persists or recurs.   Yes [provider]  tiZANidine (ZANAFLEX) 2 MG tablet Take 2 mg by mouth 2 (two) times daily. 08/03/17  Yes [provider]  baclofen (LIORESAL) 10 MG tablet Take 0.5 tablets (5 mg total) by mouth 3 (three) times daily. Patient not taking: Reported on 08/19/2017 07/08/17   Kathrynn Ducking, MD  Erenumab-aooe (AIMOVIG 140 DOSE) 70 MG/ML SOAJ Inject 140 mg into the skin every 30 (thirty) days. Patient not taking: Reported on 08/19/2017 04/20/17   Kathrynn Ducking, MD  ondansetron (ZOFRAN ODT) 4 MG disintegrating tablet Take 1 tablet (4 mg total) by mouth every 8 (eight) hours as needed for nausea or vomiting. Patient not taking: Reported on  08/19/2017 04/09/17   Earleen Newport, MD    Allergies Prednisone  Family History  Problem Relation Age of Onset  . Hypertension Father   . Cancer Father   . Cancer Maternal Grandmother   . COPD Maternal Grandmother   . Migraines Mother     Social History Social History   Tobacco Use  . Smoking status: Former Smoker    Packs/day: 0.00    Years: 0.00    Pack years: 0.00  . Smokeless tobacco: Never Used  Substance Use Topics  . Alcohol use: No  . Drug use: No    Review of Systems  Constitutional: No fever/chills Eyes: No visual changes. ENT: No sore  throat. Cardiovascular: Denies chest pain. Respiratory: Denies shortness of breath. Gastrointestinal: No abdominal pain.   nausea, no vomiting.  No diarrhea.  No constipation. Genitourinary: Negative for dysuria. Musculoskeletal: Negative for back pain. Skin: Negative for rash. Neurological: Negative for headaches, focal weakness  ____________________________________________   PHYSICAL EXAM:  VITAL SIGNS: ED Triage Vitals [08/19/17 0734]  Enc Vitals Group     BP 122/81     Pulse Rate (!) 116     Resp 16     Temp 99.1 F (37.3 C)     Temp Source Oral     SpO2 98 %     Weight 203 lb (92.1 kg)     Height 5\' 6"  (1.676 m)     Head Circumference      Peak Flow      Pain Score 8     Pain Loc      Pain Edu?      Excl. in [place]?     Constitutional: Alert and oriented.  in some acute distress. Eyes: Conjunctivae are normal. PERRL. EOMI. fundi appear normal Head: Atraumatic. Nose: No congestion/rhinnorhea. Mouth/Throat: Mucous membranes are moist.  Oropharynx non-erythematous. Neck: No stridor.  Cardiovascular: Normal rate, regular rhythm. Grossly normal heart sounds.  Good peripheral circulation. Respiratory: Normal respiratory effort.  No retractions. Lungs CTAB. Gastrointestinal: Soft and nontender. No distention. No abdominal bruits. No CVA tenderness. Musculoskeletal: No lower extremity tenderness  Neurologic:  Normal speech and language. No gross focal neurologic deficits are appreciated.  Cranial nerves II through XII are intact although visual fields were not checked cerebellar finger-nose is normal motor strength is 5 out of 5 throughout sensation is intact Skin:  Skin is warm, dry and intact. No rash noted. Psychiatric: Mood and affect are normal. Speech and behavior are normal.  ____________________________________________   LABS (all labs ordered are listed, but only abnormal results are displayed)  Labs Reviewed - No data to  display ____________________________________________  EKG   ____________________________________________  RADIOLOGY    Official radiology report(s): No results found.  ____________________________________________   PROCEDURES  Procedure(s) performed:   Procedures  Critical Care performed:   ____________________________________________   INITIAL IMPRESSION / ASSESSMENT AND PLAN / ED COURSE  Patient's headache is much better she wants to go home I will allow her to do so no focal findings on exam      ____________________________________________   FINAL CLINICAL IMPRESSION(S) / ED DIAGNOSES  Final diagnoses:  Migraine without aura and with status migrainosus, not intractable     ED Discharge Orders    None       Note:  This document was prepared using Dragon voice recognition software and may include unintentional dictation errors.    Nena Polio, MD 08/19/17 [phone]

## 2017-08-19 NOTE — ED Notes (Signed)
Report to Christina, RN

## 2017-08-19 NOTE — ED Triage Notes (Signed)
Pt c/o migraine for the past 2 days. States she has taken Rx migraine meds with no relief, states she is having a spinal injection today for her neck issues and can not take IBU.

## 2017-08-19 NOTE — Discharge Instructions (Signed)
Please follow-up with your doctor.  Please return for any further problems.

## 2017-08-26 ENCOUNTER — Encounter: Payer: Medicaid Other | Admitting: Physical Therapy

## 2018-01-25 ENCOUNTER — Telehealth: Payer: Self-pay | Admitting: Surgical

## 2018-01-25 MED ORDER — IBUPROFEN 800 MG PO TABS: 800.0000 mg | ORAL_TABLET | Freq: Three times a day (TID) | ORAL | 0 refills | Status: AC | PRN

## 2018-01-25 NOTE — Telephone Encounter (Signed)
Patient called for medication refill of Ibuprofen. I refilled and scheduled her to come back in 03/30/2018.

## 2018-03-30 ENCOUNTER — Ambulatory Visit: Payer: Self-pay | Admitting: Obstetrics and Gynecology

## 2019-11-08 IMAGING — CT CT HEAD W/O CM
3 series · 16 of 46 positions shown, 19 images · non-contrast
Comparison: None.

CLINICAL DATA: Severe headaches

EXAM:
CT HEAD WITHOUT CONTRAST
TECHNIQUE: Contiguous axial images were obtained from the base of the skull
through the vertex without intravenous contrast.

[Series 2: head wo · axial · 0.40mm/px · z∈[-165,-45]mm · 10 of 29 slices shown, 13 images]
[im 3/29  brain]
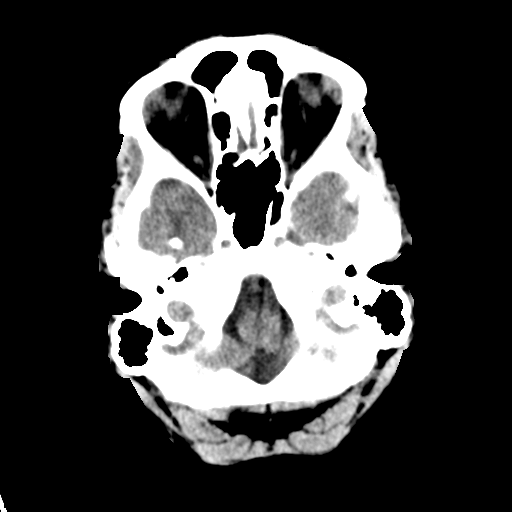
[im 3/29  bone]
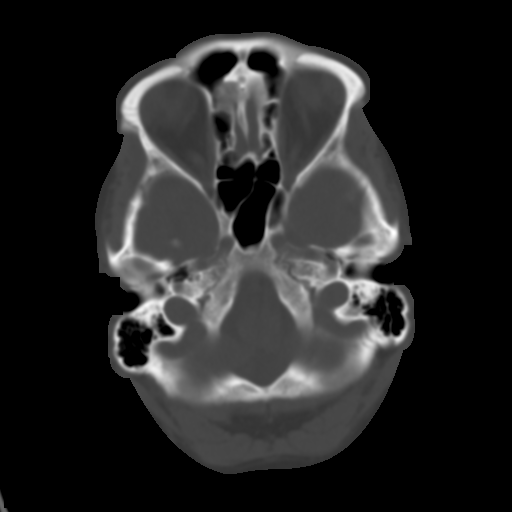
[im 6/29  brain]
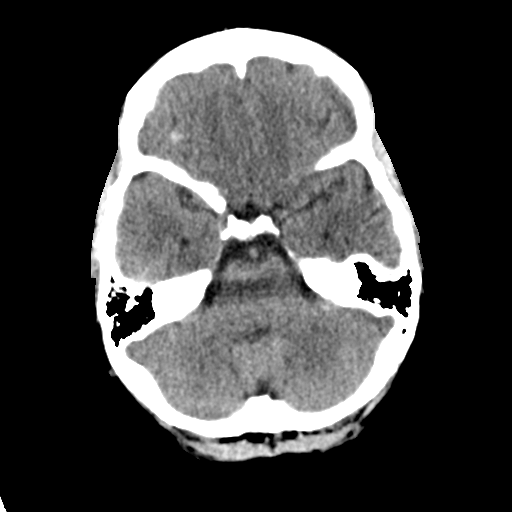
[im 8/29  brain]
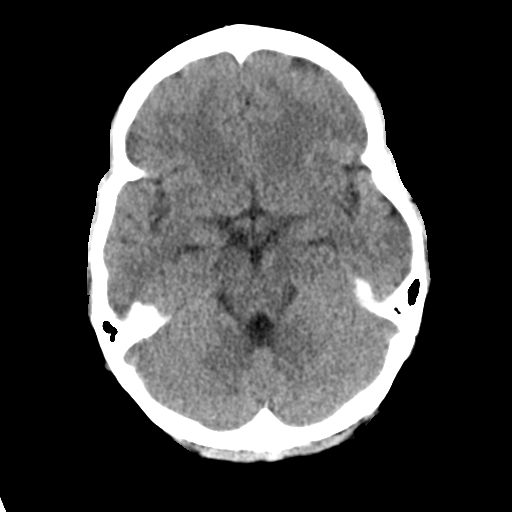
[im 11/29  brain]
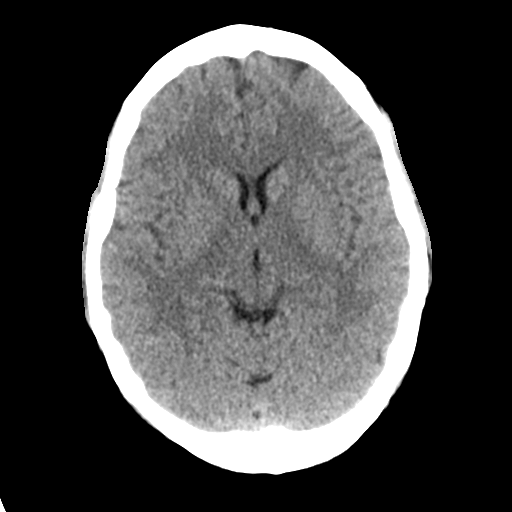
[im 14/29  brain]
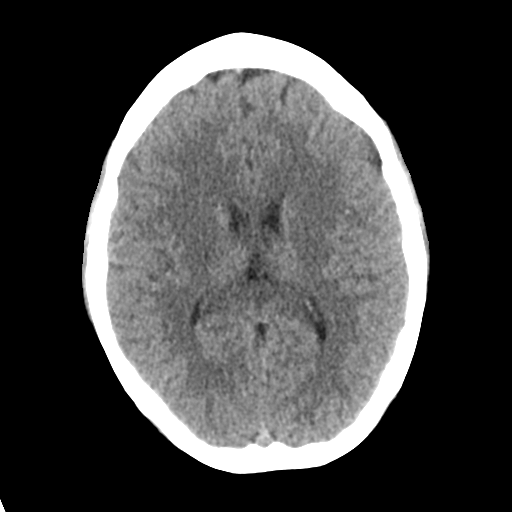
[im 14/29  bone]
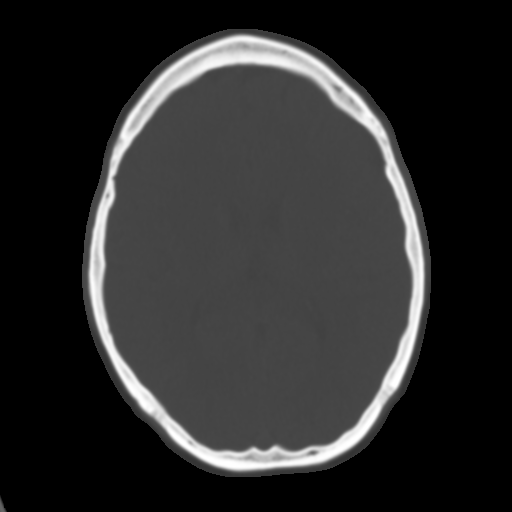
[im 16/29  brain]
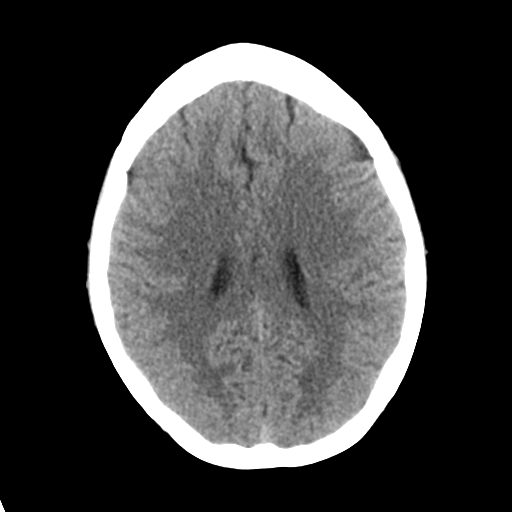
[im 19/29  brain]
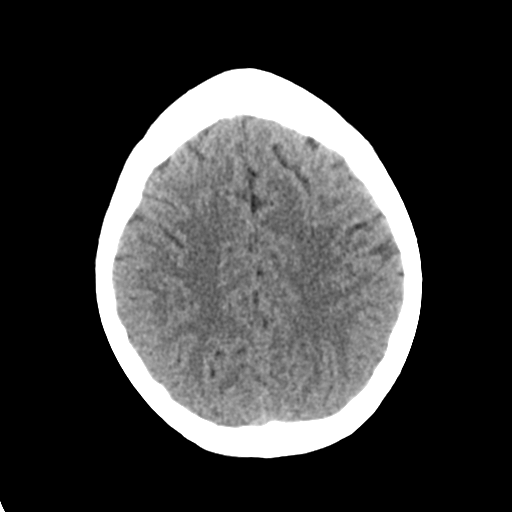
[im 22/29  brain]
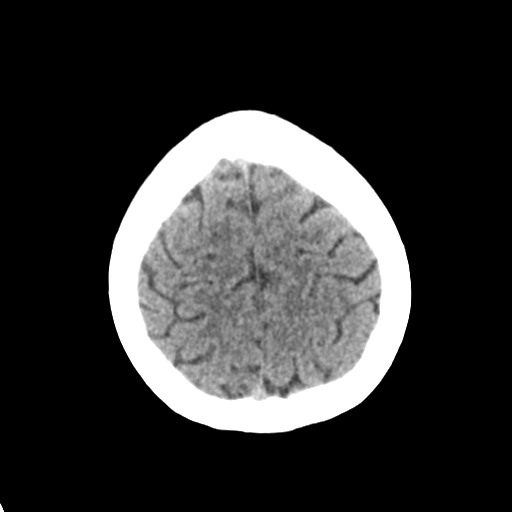
[im 24/29  brain]
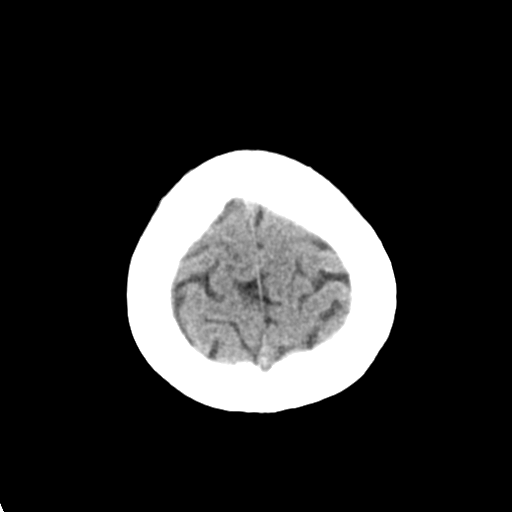
[im 24/29  bone]
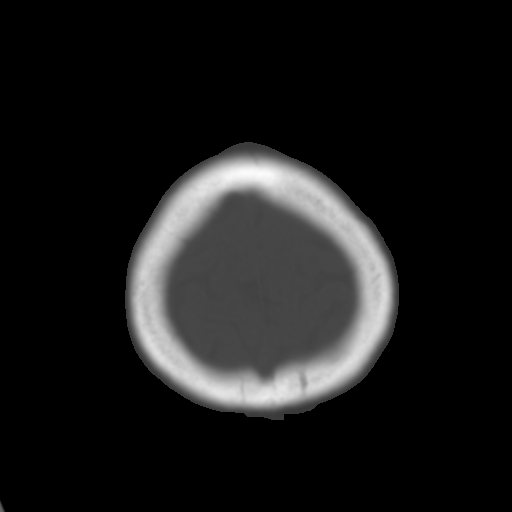
[im 27/29  brain]
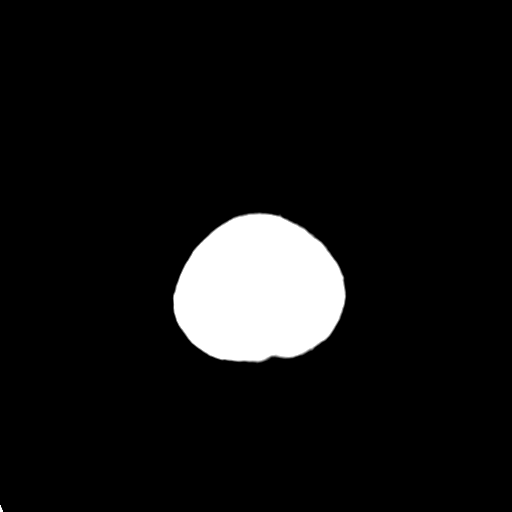

[Series 4: coronal soft tissue · coronal · 0.31mm/px · 3 of 62 slices shown]
[im 21/62  brain]
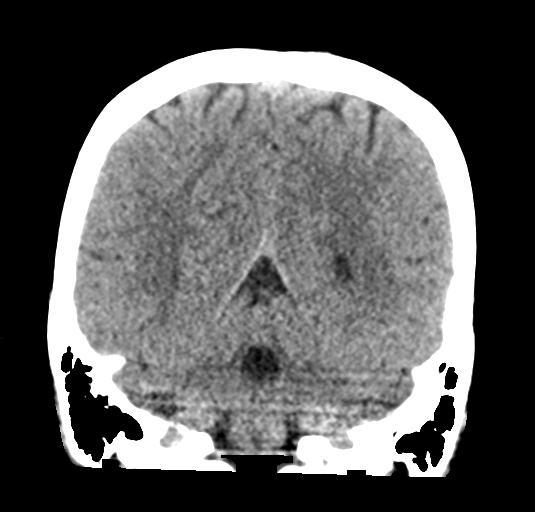
[im 28/62  brain]
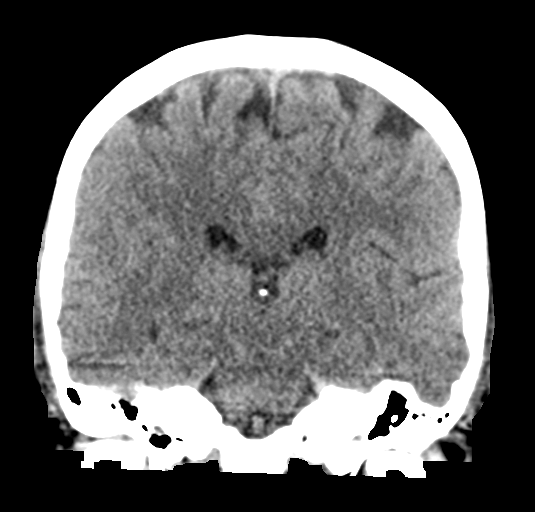
[im 34/62  brain]
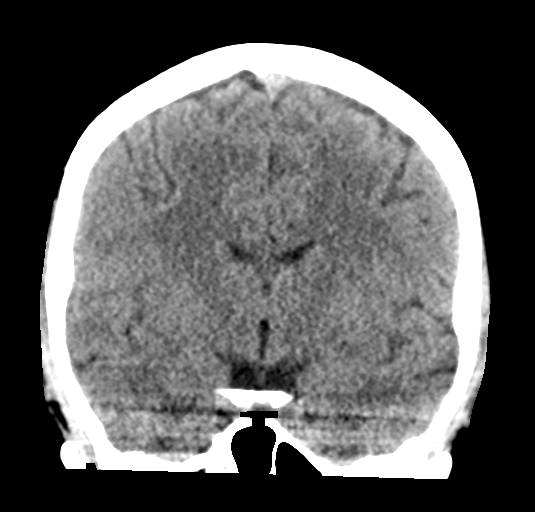

[Series 5: sagittal soft tissue · sagittal · 0.31mm/px · 3 of 52 slices shown]
[im 18/52  brain]
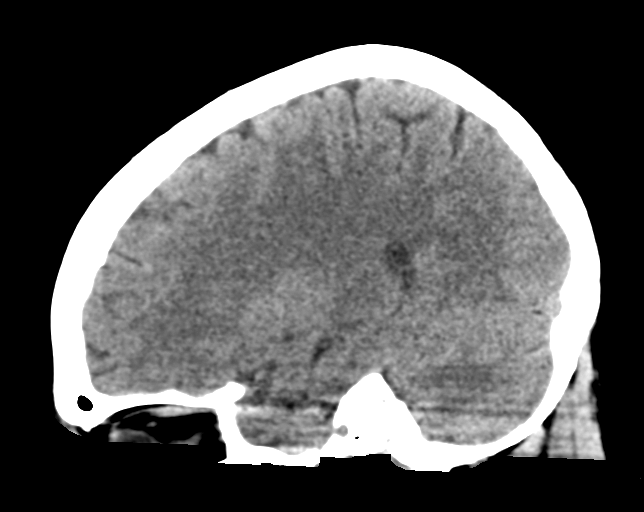
[im 26/52  brain]
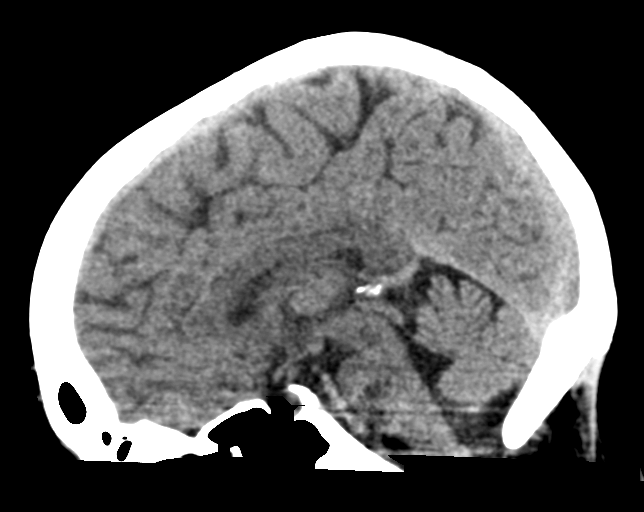
[im 35/52  brain]
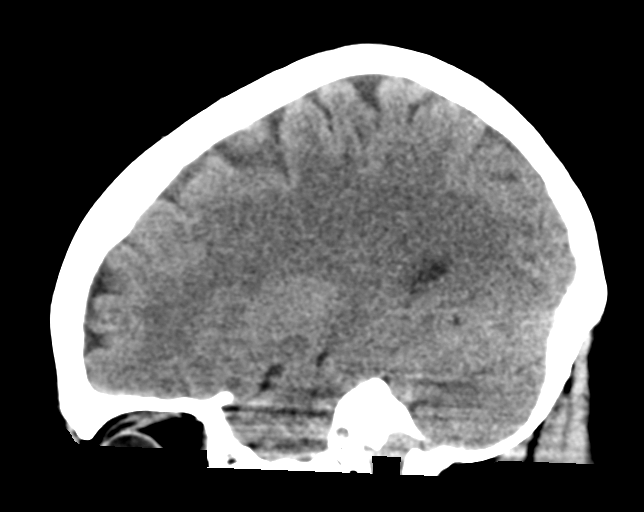

[16 of 46 positions shown; findings below may reference images not displayed]

FINDINGS: Brain: No acute intracranial abnormality. Specifically, no
hemorrhage, hydrocephalus, mass lesion, acute infarction, or
significant intracranial injury.

Vascular: No hyperdense vessel or unexpected calcification.

Skull: No acute calvarial abnormality.

Sinuses/Orbits: Visualized paranasal sinuses and mastoids clear.
Orbital soft tissues unremarkable.

Other: None
IMPRESSION: Normal study.

## 2023-06-02 ENCOUNTER — Emergency Department
Admission: EM | Admit: 2023-06-02 | Discharge: 2023-06-02 | Disposition: A | Discharge status: Home / Self Care | Attending: Emergency Medicine | Admitting: Emergency Medicine

## 2023-06-02 ENCOUNTER — Encounter: Payer: Self-pay | Admitting: Emergency Medicine

## 2023-06-02 ENCOUNTER — Other Ambulatory Visit: Payer: Self-pay

## 2023-06-02 DIAGNOSIS — R609 Edema, unspecified: Secondary | ICD-10-CM | POA: Diagnosis present

## 2023-06-02 DIAGNOSIS — I10 Essential (primary) hypertension: Secondary | ICD-10-CM | POA: Diagnosis not present

## 2023-06-02 DIAGNOSIS — R600 Localized edema: Secondary | ICD-10-CM | POA: Insufficient documentation

## 2023-06-02 DIAGNOSIS — Z8541 Personal history of malignant neoplasm of cervix uteri: Secondary | ICD-10-CM | POA: Insufficient documentation

## 2023-06-02 LAB — BASIC METABOLIC PANEL WITH GFR
Anion gap: 8 (ref 5–15)
BUN: 9 mg/dL (ref 6–20)
CO2: 25 mmol/L (ref 22–32)
Calcium: 8.3 mg/dL — ABNORMAL LOW (ref 8.9–10.3)
Chloride: 107 mmol/L (ref 98–111)
Creatinine, Ser: 0.96 mg/dL (ref 0.44–1.00)
GFR, Estimated: >60 mL/min (ref >60)
Glucose, Bld: 113 mg/dL — ABNORMAL HIGH (ref 70–99)
Potassium: 3.8 mmol/L (ref 3.5–5.1)
Sodium: 140 mmol/L (ref 135–145)

## 2023-06-02 LAB — CBC WITH DIFFERENTIAL/PLATELET
Abs Immature Granulocytes: 0.04 10*3/uL (ref 0.00–0.07)
Basophils Absolute: 0.1 10*3/uL (ref 0.0–0.1)
Basophils Relative: 1 %
Eosinophils Absolute: 0.3 10*3/uL (ref 0.0–0.5)
Eosinophils Relative: 4 %
HCT: 39.8 % (ref 36.0–46.0)
Hemoglobin: 13.3 g/dL (ref 12.0–15.0)
Immature Granulocytes: 1 %
Lymphocytes Relative: 29 %
Lymphs Abs: 2.4 10*3/uL (ref 0.7–4.0)
MCH: 31.7 pg (ref 26.0–34.0)
MCHC: 33.4 g/dL (ref 30.0–36.0)
MCV: 94.8 fL (ref 80.0–100.0)
Monocytes Absolute: 0.9 10*3/uL (ref 0.1–1.0)
Monocytes Relative: 11 %
Neutro Abs: 4.5 10*3/uL (ref 1.7–7.7)
Neutrophils Relative %: 54 %
Platelets: 404 10*3/uL — ABNORMAL HIGH (ref 150–400)
RBC: 4.2 MIL/uL (ref 3.87–5.11)
RDW: 14.3 % (ref 11.5–15.5)
WBC: 8.1 10*3/uL (ref 4.0–10.5)
nRBC: 0 % (ref 0.0–0.2)

## 2023-06-02 LAB — BRAIN NATRIURETIC PEPTIDE: B Natriuretic Peptide: 92 pg/mL (ref 0.0–100.0)

## 2023-06-02 MED ORDER — FUROSEMIDE 10 MG/ML IJ SOLN
40.0000 mg | Freq: Once | INTRAMUSCULAR | Status: AC
  Administered 2023-06-02: 40 mg via INTRAVENOUS
  Filled 2023-06-02: qty 4

## 2023-06-02 MED ORDER — FUROSEMIDE 40 MG PO TABS: 40.0000 mg | ORAL_TABLET | Freq: Every day | ORAL | 0 refills | Status: AC

## 2023-06-02 NOTE — ED Triage Notes (Signed)
 Patient to ED via POV for swelling all over body. States ongoing x2 days. States last time this happened she got admitted needing IV lasix. Denies CP or SOB.

## 2023-06-02 NOTE — ED Notes (Signed)
 Patient to desk asking about wait time. PT informed we are unable to give out wait times. Pt stating she is going to go to another hospital. NAD noted. Ambulatory with steady gait.

## 2023-06-02 NOTE — ED Provider Notes (Signed)
 [facility] Provider Note    Event Date/Time   First MD Initiated Contact with Patient 06/02/23 1812     (approximate)   History   Chief Complaint Edema   HPI  Denise Patrick is a 43 y.o. female with past medical history of chronic migraines, idiopathic intracranial hypertension, endometriosis, and cervical cancer who presents to the ED complaining of edema.  Patient reports that she has been dealing with increased swelling in her legs, arms, and abdomen over the past 5 days.  She has not noticed any redness or pain to her extremities, denies any chest pain or difficulty breathing.  She states she has dealt with this issue before and has been prescribed 20 mg Lasix to take daily, has not had any missed doses.  She does report recent weight gain of about 10 pounds over the past 2 weeks.     Physical Exam   Triage Vital Signs: ED Triage Vitals  Encounter Vitals Group     BP 06/02/23 1547 (!) 144/83     Systolic BP Percentile --      Diastolic BP Percentile --      Pulse Rate 06/02/23 1547 (!) 108     Resp 06/02/23 1547 17     Temp 06/02/23 1547 98.3 F (36.8 C)     Temp Source 06/02/23 1547 Oral     SpO2 06/02/23 1547 98 %     Weight 06/02/23 1546 175 lb (79.4 kg)     Height 06/02/23 1546 5\' 6"  (1.676 m)     Head Circumference --      Peak Flow --      Pain Score 06/02/23 1545 6     Pain Loc --      Pain Education --      Exclude from Growth Chart --     Most recent vital signs: Vitals:   06/02/23 1547 06/02/23 1728  BP: (!) 144/83 136/87  Pulse: (!) 108 (!) 109  Resp: 17 20  Temp: 98.3 F (36.8 C) 98.6 F (37 C)  SpO2: 98% 97%    Constitutional: Alert and oriented. Eyes: Conjunctivae are normal. Head: Atraumatic. Nose: No congestion/rhinnorhea. Mouth/Throat: Mucous membranes are moist.  Cardiovascular: Normal rate, regular rhythm. Grossly normal heart sounds.  2+ radial and BP pulses bilaterally. Respiratory: Normal respiratory  effort.  No retractions. Lungs CTAB. Gastrointestinal: Soft and nontender. No distention. Musculoskeletal: No lower extremity tenderness, 1+ pitting edema to knees bilaterally.  Trace pitting edema to bilateral forearms. Neurologic:  Normal speech and language. No gross focal neurologic deficits are appreciated.    ED Results / Procedures / Treatments   Labs (all labs ordered are listed, but only abnormal results are displayed) Labs Reviewed  CBC WITH DIFFERENTIAL/PLATELET - Abnormal; Notable for the following components:      Result Value   Platelets 404 (*)    All other components within normal limits  BASIC METABOLIC PANEL WITH GFR - Abnormal; Notable for the following components:   Glucose, Bld 113 (*)    Calcium 8.3 (*)    All other components within normal limits  BRAIN NATRIURETIC PEPTIDE    PROCEDURES:  Critical Care performed: No  Procedures   MEDICATIONS ORDERED IN ED: Medications  furosemide (LASIX) injection 40 mg (40 mg Intravenous Given 06/02/23 1835)     IMPRESSION / MDM / ASSESSMENT AND PLAN / ED COURSE  I reviewed the triage vital signs and the nursing notes.  43 y.o. female with past medical history of chronic migraines, idiopathic intracranial hypertension, endometriosis, and cervical cancer who presents to the ED complaining of 5 days of increased welling to her legs, arms, and lower abdomen.  Patient's presentation is most consistent with acute presentation with potential threat to life or bodily function.  Differential diagnosis includes, but is not limited to, CHF, AKI, electrolyte abnormality, anemia, peripheral edema, venous insufficiency.  Patient nontoxic-appearing and in no acute distress, vital signs are unremarkable.  She is neurovascular intact to her bilateral lower extremities with no findings concerning for DVT.  She has relatively mild edema to her bilateral lower extremities and even milder to both forearms,  and I am not able to appreciate significant edema to her abdomen.  Labs are reassuring without significant anemia, leukocytosis, lactate abnormality, or AKI.  She denies any difficulty breathing and BNP within normal limits, doubt CHF.  We will give dose of IV Lasix at patient request, after which she would be appropriate for outpatient follow-up with her PCP.  Patient diuresing appropriately following IV Lasix and is appropriate for discharge home with outpatient follow-up.  We will increase her dose of Lasix for 5 days and she was counseled to follow-up with her PCP, otherwise return to the ED for new or worsening symptoms.  Patient agrees with plan.      FINAL CLINICAL IMPRESSION(S) / ED DIAGNOSES   Final diagnoses:  Peripheral edema     Rx / DC Orders   ED Discharge Orders          Ordered    furosemide (LASIX) 40 MG tablet  Daily        06/02/23 1914             Note:  This document was prepared using Dragon voice recognition software and may include unintentional dictation errors.   Twilla Galea, MD 06/02/23 [phone]

## 2023-06-02 NOTE — ED Notes (Signed)
 Patient presented to [facility] Nurse Desk, decided not to LWBS. Did not leave premises.

## 2023-06-02 NOTE — ED Notes (Signed)
 C?O not feeling well.  Feeling dizzy. Ambulates with easy and steady gait.  Anxious. NAD  Skin warm and dry. REassurance given.
